# Patient Record
Sex: Female | Born: 1935 | Race: Black or African American | Hispanic: No | Marital: Married | Smoking: Never smoker
Health system: Southern US, Community
[De-identification: ages and names within clinical notes are randomized; demographics above are authoritative.]

## PROBLEM LIST (undated history)

## (undated) DIAGNOSIS — I10 Essential (primary) hypertension: Secondary | ICD-10-CM

## (undated) DIAGNOSIS — E119 Type 2 diabetes mellitus without complications: Secondary | ICD-10-CM

## (undated) DIAGNOSIS — N1832 Chronic kidney disease, stage 3b: Secondary | ICD-10-CM

---

## 2004-12-29 ENCOUNTER — Ambulatory Visit: Payer: Self-pay | Admitting: Internal Medicine

## 2006-01-15 ENCOUNTER — Ambulatory Visit: Payer: Self-pay | Admitting: Internal Medicine

## 2006-05-30 ENCOUNTER — Ambulatory Visit: Payer: Self-pay | Admitting: Unknown Physician Specialty

## 2007-03-18 ENCOUNTER — Ambulatory Visit: Payer: Self-pay | Admitting: Family Medicine

## 2008-03-19 ENCOUNTER — Ambulatory Visit: Payer: Self-pay | Admitting: Internal Medicine

## 2009-03-22 ENCOUNTER — Ambulatory Visit: Payer: Self-pay | Admitting: Family Medicine

## 2010-07-19 ENCOUNTER — Ambulatory Visit: Payer: Self-pay | Admitting: Family Medicine

## 2012-02-14 ENCOUNTER — Ambulatory Visit: Payer: Self-pay | Admitting: Family Medicine

## 2013-02-18 ENCOUNTER — Ambulatory Visit: Payer: Self-pay | Admitting: Family Medicine

## 2014-07-09 ENCOUNTER — Ambulatory Visit: Payer: Self-pay | Admitting: Family Medicine

## 2016-09-01 ENCOUNTER — Other Ambulatory Visit: Payer: Self-pay | Admitting: Internal Medicine

## 2016-09-01 DIAGNOSIS — E213 Hyperparathyroidism, unspecified: Secondary | ICD-10-CM

## 2016-11-24 ENCOUNTER — Ambulatory Visit
Admission: EM | Admit: 2016-11-24 | Discharge: 2016-11-24 | Disposition: A | Discharge status: Home / Self Care | Payer: Medicare Other | Attending: Family Medicine | Admitting: Family Medicine

## 2016-11-24 ENCOUNTER — Encounter: Payer: Self-pay | Admitting: Emergency Medicine

## 2016-11-24 DIAGNOSIS — R05 Cough: Secondary | ICD-10-CM | POA: Diagnosis not present

## 2016-11-24 DIAGNOSIS — J9801 Acute bronchospasm: Secondary | ICD-10-CM | POA: Diagnosis not present

## 2016-11-24 DIAGNOSIS — R059 Cough, unspecified: Secondary | ICD-10-CM

## 2016-11-24 HISTORY — DX: Essential (primary) hypertension: I10

## 2016-11-24 HISTORY — DX: Type 2 diabetes mellitus without complications: E11.9

## 2016-11-24 MED ORDER — BENZONATATE 100 MG PO CAPS: 100.0000 mg | ORAL_CAPSULE | Freq: Three times a day (TID) | ORAL | 0 refills | Status: DC | PRN

## 2016-11-24 MED ORDER — ALBUTEROL SULFATE HFA 108 (90 BASE) MCG/ACT IN AERS: 1.0000 | INHALATION_SPRAY | Freq: Four times a day (QID) | RESPIRATORY_TRACT | 0 refills | Status: AC | PRN

## 2016-11-24 MED ORDER — DOXYCYCLINE HYCLATE 100 MG PO TABS: 100.0000 mg | ORAL_TABLET | Freq: Two times a day (BID) | ORAL | 0 refills | Status: DC

## 2016-11-24 NOTE — ED Triage Notes (Signed)
Patient c/o cough and chest congestion for 2 weeks.   

## 2016-11-24 NOTE — ED Provider Notes (Signed)
[facility]    CSN: [id number] Arrival date & time: 11/24/16  0929     History   Chief Complaint Chief Complaint  Patient presents with  . Cough    HPI Daisy Hernandez is a 83 y.o. female.   The history is provided by the patient.  Cough  Cough characteristics:  Productive Associated symptoms: wheezing   URI  Presenting symptoms: congestion, cough and fatigue   Severity:  Moderate Onset quality:  Sudden Duration:  2 weeks Timing:  Constant Progression:  Worsening Chronicity:  New Relieved by:  Nothing Ineffective treatments:  OTC medications and decongestant Associated symptoms: wheezing   Risk factors: being elderly, chronic kidney disease, diabetes mellitus, recent illness and sick contacts   Risk factors: no chronic cardiac disease, no chronic respiratory disease, no immunosuppression and no recent travel     Past Medical History:  Diagnosis Date  . Diabetes mellitus without complication (HCC)   . Hypertension     There are no active problems to display for this patient.   History reviewed. No pertinent surgical history.  OB History    No data available       Home Medications    Prior to Admission medications   Medication Sig Start Date End Date Taking? Authorizing Provider  amLODipine (NORVASC) 5 MG tablet Take 5 mg by mouth daily.   Yes Historical Provider, MD  glimepiride (AMARYL) 2 MG tablet Take 2 mg by mouth daily with breakfast.   Yes Historical Provider, MD  pravastatin (PRAVACHOL) 20 MG tablet Take 20 mg by mouth daily.   Yes Historical Provider, MD  albuterol (PROVENTIL HFA;VENTOLIN HFA) 108 (90 Base) MCG/ACT inhaler Inhale 1-2 puffs into the lungs every 6 (six) hours as needed for wheezing or shortness of breath. 11/24/16   Payton Mccallum, MD  benzonatate (TESSALON) 100 MG capsule Take 1 capsule (100 mg total) by mouth 3 (three) times daily as needed for cough. 11/24/16   Payton Mccallum, MD  doxycycline (VIBRA-TABS) 100 MG  tablet Take 1 tablet (100 mg total) by mouth 2 (two) times daily. 11/24/16   Payton Mccallum, MD    Family History History reviewed. No pertinent family history.  Social History Social History  Substance Use Topics  . Smoking status: Never Smoker  . Smokeless tobacco: Never Used  . Alcohol use No     Allergies   Patient has no known allergies.   Review of Systems Review of Systems  Constitutional: Positive for fatigue.  HENT: Positive for congestion.   Respiratory: Positive for cough and wheezing.      Physical Exam Triage Vital Signs ED Triage Vitals  Enc Vitals Group     BP 11/24/16 1001 (!) 157/69     Pulse Rate 11/24/16 1001 77     Resp 11/24/16 1001 16     Temp 11/24/16 1001 98.6 F (37 C)     Temp Source 11/24/16 1001 Oral     SpO2 11/24/16 1001 96 %     Weight 11/24/16 0958 190 lb (86.2 kg)     Height 11/24/16 0958  (1.702 m)     Head Circumference --      Peak Flow --      Pain Score 11/24/16 0958 0     Pain Loc --      Pain Edu? --      Excl. in GC? --    No data found.   Updated Vital Signs BP (!) 157/69 (BP Location: Left  Arm)   Pulse 77   Temp 98.6 F (37 C) (Oral)   Resp 16   Ht  (1.702 m)   Wt 190 lb (86.2 kg)   SpO2 96%   BMI 29.76 kg/m   Visual Acuity Right Eye Distance:   Left Eye Distance:   Bilateral Distance:    Right Eye Near:   Left Eye Near:    Bilateral Near:     Physical Exam  Constitutional: She appears well-developed and well-nourished. No distress.  HENT:  Head: Normocephalic and atraumatic.  Right Ear: Tympanic membrane, external ear and ear canal normal.  Left Ear: Tympanic membrane, external ear and ear canal normal.  Nose: Rhinorrhea present. No mucosal edema, nose lacerations, sinus tenderness, nasal deformity, septal deviation or nasal septal hematoma. No epistaxis.  No foreign bodies. Right sinus exhibits no maxillary sinus tenderness and no frontal sinus tenderness. Left sinus exhibits no maxillary  sinus tenderness and no frontal sinus tenderness.  Mouth/Throat: Uvula is midline, oropharynx is clear and moist and mucous membranes are normal. No oropharyngeal exudate. No tonsillar exudate.  Eyes: Conjunctivae and EOM are normal. Pupils are equal, round, and reactive to light. Right eye exhibits no discharge. Left eye exhibits no discharge. No scleral icterus.  Neck: Normal range of motion. Neck supple. No thyromegaly present.  Cardiovascular: Normal rate, regular rhythm and normal heart sounds.   Pulmonary/Chest: Effort normal. No respiratory distress. She has wheezes (mild, diffuse, bilateral; and diffuse rhonchi). She has no rales.  Lymphadenopathy:    She has no cervical adenopathy.  Skin: She is not diaphoretic.  Nursing note and vitals reviewed.    UC Treatments / Results  Labs (all labs ordered are listed, but only abnormal results are displayed) Labs Reviewed - No data to display  EKG  EKG Interpretation None       Radiology No results found.  Procedures Procedures (including critical care time)  Medications Ordered in UC Medications - No data to display   Initial Impression / Assessment and Plan / UC Course  I have reviewed the triage vital signs and the nursing notes.  Pertinent labs & imaging results that were available during my care of the patient were reviewed by me and considered in my medical decision making (see chart for details).      Final Clinical Impressions(s) / UC Diagnoses   Final diagnoses:  Cough  Bronchospasm    New Prescriptions Discharge Medication List as of 11/24/2016 10:19 AM    START taking these medications   Details  albuterol (PROVENTIL HFA;VENTOLIN HFA) 108 (90 Base) MCG/ACT inhaler Inhale 1-2 puffs into the lungs every 6 (six) hours as needed for wheezing or shortness of breath., Starting Fri 11/24/2016, Normal    benzonatate (TESSALON) 100 MG capsule Take 1 capsule (100 mg total) by mouth 3 (three) times daily as  needed for cough., Starting Fri 11/24/2016, Normal    doxycycline (VIBRA-TABS) 100 MG tablet Take 1 tablet (100 mg total) by mouth 2 (two) times daily., Starting Fri 11/24/2016, Normal       1. diagnosis reviewed with patient 2. rx as per orders above; reviewed possible side effects, interactions, risks and benefits  3. Recommend supportive treatment with rest, fluids 4. Follow-up prn if symptoms worsen or don't improve   Payton Mccallum, MD 11/24/16 1045

## 2017-03-22 ENCOUNTER — Other Ambulatory Visit: Payer: Self-pay | Admitting: Nephrology

## 2017-03-28 ENCOUNTER — Ambulatory Visit
Admission: RE | Admit: 2017-03-28 | Discharge: 2017-03-28 | Disposition: A | Discharge status: Home / Self Care | Payer: Medicare Other | Source: Ambulatory Visit | Attending: Nephrology | Admitting: Nephrology

## 2017-03-28 ENCOUNTER — Ambulatory Visit: Payer: Medicare Other

## 2017-05-15 ENCOUNTER — Other Ambulatory Visit: Payer: Self-pay | Admitting: Family Medicine

## 2017-05-15 DIAGNOSIS — Z1231 Encounter for screening mammogram for malignant neoplasm of breast: Secondary | ICD-10-CM

## 2018-05-06 ENCOUNTER — Other Ambulatory Visit: Payer: Self-pay | Admitting: Nephrology

## 2018-09-04 ENCOUNTER — Other Ambulatory Visit: Payer: Self-pay | Admitting: Nephrology

## 2020-03-28 ENCOUNTER — Other Ambulatory Visit: Payer: Self-pay

## 2020-03-28 ENCOUNTER — Emergency Department: Payer: Medicare Other

## 2020-03-28 ENCOUNTER — Inpatient Hospital Stay
Admission: EM | Admit: 2020-03-28 | Discharge: 2020-03-31 | DRG: 690 | Disposition: A | Discharge status: Home / Self Care | Payer: Medicare Other | Attending: Student | Admitting: Student

## 2020-03-28 DIAGNOSIS — N10 Acute tubulo-interstitial nephritis: Secondary | ICD-10-CM | POA: Diagnosis not present

## 2020-03-28 DIAGNOSIS — Z1611 Resistance to penicillins: Secondary | ICD-10-CM | POA: Diagnosis present

## 2020-03-28 DIAGNOSIS — Z79899 Other long term (current) drug therapy: Secondary | ICD-10-CM

## 2020-03-28 DIAGNOSIS — N1832 Chronic kidney disease, stage 3b: Secondary | ICD-10-CM | POA: Diagnosis present

## 2020-03-28 DIAGNOSIS — Z20822 Contact with and (suspected) exposure to covid-19: Secondary | ICD-10-CM | POA: Diagnosis present

## 2020-03-28 DIAGNOSIS — I129 Hypertensive chronic kidney disease with stage 1 through stage 4 chronic kidney disease, or unspecified chronic kidney disease: Secondary | ICD-10-CM | POA: Diagnosis present

## 2020-03-28 DIAGNOSIS — Z7982 Long term (current) use of aspirin: Secondary | ICD-10-CM

## 2020-03-28 DIAGNOSIS — R7881 Bacteremia: Secondary | ICD-10-CM | POA: Diagnosis present

## 2020-03-28 DIAGNOSIS — N1831 Chronic kidney disease, stage 3a: Secondary | ICD-10-CM | POA: Diagnosis present

## 2020-03-28 DIAGNOSIS — E119 Type 2 diabetes mellitus without complications: Secondary | ICD-10-CM

## 2020-03-28 DIAGNOSIS — N179 Acute kidney failure, unspecified: Secondary | ICD-10-CM | POA: Diagnosis present

## 2020-03-28 DIAGNOSIS — R509 Fever, unspecified: Secondary | ICD-10-CM | POA: Diagnosis not present

## 2020-03-28 DIAGNOSIS — Z888 Allergy status to other drugs, medicaments and biological substances status: Secondary | ICD-10-CM

## 2020-03-28 DIAGNOSIS — E876 Hypokalemia: Secondary | ICD-10-CM | POA: Diagnosis present

## 2020-03-28 DIAGNOSIS — I10 Essential (primary) hypertension: Secondary | ICD-10-CM | POA: Diagnosis present

## 2020-03-28 DIAGNOSIS — E1122 Type 2 diabetes mellitus with diabetic chronic kidney disease: Secondary | ICD-10-CM | POA: Diagnosis present

## 2020-03-28 DIAGNOSIS — B961 Klebsiella pneumoniae [K. pneumoniae] as the cause of diseases classified elsewhere: Secondary | ICD-10-CM | POA: Diagnosis present

## 2020-03-28 DIAGNOSIS — R001 Bradycardia, unspecified: Secondary | ICD-10-CM | POA: Diagnosis present

## 2020-03-28 HISTORY — DX: Chronic kidney disease, stage 3b: N18.32

## 2020-03-28 LAB — COMPREHENSIVE METABOLIC PANEL
ALT: 11 U/L (ref 0–44)
AST: 18 U/L (ref 15–41)
Albumin: 4.3 g/dL (ref 3.5–5.0)
Alkaline Phosphatase: 56 U/L (ref 38–126)
Anion gap: 9 (ref 5–15)
BUN: 24 mg/dL — ABNORMAL HIGH (ref 8–23)
CO2: 24 mmol/L (ref 22–32)
Calcium: 9.8 mg/dL (ref 8.9–10.3)
Chloride: 105 mmol/L (ref 98–111)
Creatinine, Ser: 1.57 mg/dL — ABNORMAL HIGH (ref 0.44–1.00)
GFR calc Af Amer: 35 mL/min — ABNORMAL LOW (ref >60)
GFR calc non Af Amer: 30 mL/min — ABNORMAL LOW (ref >60)
Glucose, Bld: 150 mg/dL — ABNORMAL HIGH (ref 70–99)
Potassium: 3.3 mmol/L — ABNORMAL LOW (ref 3.5–5.1)
Sodium: 138 mmol/L (ref 135–145)
Total Bilirubin: 0.7 mg/dL (ref 0.3–1.2)
Total Protein: 6.8 g/dL (ref 6.5–8.1)

## 2020-03-28 LAB — URINALYSIS, COMPLETE (UACMP) WITH MICROSCOPIC
Bilirubin Urine: NEGATIVE
Glucose, UA: NEGATIVE mg/dL
Ketones, ur: NEGATIVE mg/dL
Nitrite: NEGATIVE
Protein, ur: 100 mg/dL — AB
RBC / HPF: >50 RBC/hpf — ABNORMAL HIGH (ref 0–5)
Specific Gravity, Urine: 1.015 (ref 1.005–1.030)
WBC, UA: >50 WBC/hpf — ABNORMAL HIGH (ref 0–5)
pH: 5 (ref 5.0–8.0)

## 2020-03-28 LAB — CBC
HCT: 35.1 % — ABNORMAL LOW (ref 36.0–46.0)
Hemoglobin: 11.7 g/dL — ABNORMAL LOW (ref 12.0–15.0)
MCH: 29.9 pg (ref 26.0–34.0)
MCHC: 33.3 g/dL (ref 30.0–36.0)
MCV: 89.8 fL (ref 80.0–100.0)
Platelets: 173 10*3/uL (ref 150–400)
RBC: 3.91 MIL/uL (ref 3.87–5.11)
RDW: 12.5 % (ref 11.5–15.5)
WBC: 11 10*3/uL — ABNORMAL HIGH (ref 4.0–10.5)
nRBC: 0 % (ref 0.0–0.2)

## 2020-03-28 LAB — POC SARS CORONAVIRUS 2 AG: SARS Coronavirus 2 Ag: NEGATIVE

## 2020-03-28 LAB — LACTIC ACID, PLASMA: Lactic Acid, Venous: 1.4 mmol/L (ref 0.5–1.9)

## 2020-03-28 NOTE — ED Provider Notes (Signed)
[facility] Emergency Department Provider Note   ____________________________________________    I have reviewed the triage vital signs and the nursing notes.   HISTORY  Chief Complaint Fever     HPI Daisy Hernandez is a 86 y.o. female with a history of diabetes and hypertension who presents with complaints of chills.  Daughter reports patient had a great day yesterday and was active at a cookout, today she was shaking and complaining of chills.  Overall though patient reports she feels "okay ".  Denies shortness of breath or cough.  No dysuria.  No rash reported.  No abdominal pain nausea or vomiting.  Has been vaccinated against COVID-19  Past Medical History:  Diagnosis Date  . Chronic kidney disease, stage 3b   . Diabetes mellitus without complication (HCC)   . Hypertension     Patient Active Problem List   Diagnosis Date Noted  . Sepsis due to urinary tract infection (HCC) 03/29/2020  . Acute pyelonephritis 03/29/2020  . Diabetes mellitus without complication (HCC)   . Hypertension   . Hypokalemia   . Chronic kidney disease, stage 3b     History reviewed. No pertinent surgical history.  Prior to Admission medications   Medication Sig Start Date End Date Taking? Authorizing Provider  albuterol (PROVENTIL HFA;VENTOLIN HFA) 108 (90 Base) MCG/ACT inhaler Inhale 1-2 puffs into the lungs every 6 (six) hours as needed for wheezing or shortness of breath. 11/24/16  Yes Payton Mccallum, MD  amLODipine (NORVASC) 10 MG tablet Take 10 mg by mouth daily. 02/22/20  Yes [provider]  aspirin 81 MG EC tablet Take 81 mg by mouth daily.   Yes [provider]  Cholecalciferol 50 MCG (2000 UT) CAPS Take 3 capsules daily for 3 months, then reduce to 1 capsule daily thereafter for Vitamin D Deficiency. 05/16/19  Yes [provider]  dorzolamide (TRUSOPT) 2 % ophthalmic solution Place 1 drop into both eyes 2 (two) times daily.  01/09/20  Yes [provider]  latanoprost (XALATAN) 0.005 % ophthalmic solution Place 1 drop into both eyes at bedtime. 02/15/20  Yes [provider]  losartan (COZAAR) 100 MG tablet Take 100 mg by mouth daily. 03/23/20  Yes [provider]  pravastatin (PRAVACHOL) 40 MG tablet Take 40 mg by mouth at bedtime. 02/23/20  Yes [provider]  timolol (TIMOPTIC) 0.5 % ophthalmic solution Place 1 drop into both eyes 2 (two) times daily. 01/08/20  Yes [provider]  glimepiride (AMARYL) 2 MG tablet Take 2 mg by mouth daily with breakfast. Patient not taking: Reported on 03/28/2020    [provider]     Allergies Ace inhibitors  History reviewed. No pertinent family history.  Social History Social History   Tobacco Use  . Smoking status: Never Smoker  . Smokeless tobacco: Never Used  Substance Use Topics  . Alcohol use: No  . Drug use: Not on file    Review of Systems  Constitutional: As above Eyes: No visual changes.  ENT: No sore throat. Cardiovascular: Denies chest pain. Respiratory: Denies shortness of breath. Gastrointestinal: No abdominal pain.  No nausea, no vomiting.   Genitourinary: Negative for dysuria. Musculoskeletal: Negative for back pain. Skin: Negative for rash. Neurological: Negative for headaches   ____________________________________________   PHYSICAL EXAM:  VITAL SIGNS: ED Triage Vitals  Enc Vitals Group     BP 03/28/20 2111 (!) 135/52     Pulse Rate 03/28/20 2111 86  Resp 03/28/20 2111 14     Temp 03/28/20 2111 (!) 103.4 F (39.7 C)     Temp Source 03/28/20 2111 Oral     SpO2 03/28/20 2111 97 %     Weight 03/28/20 2112 72 kg (158 lb 12.8 oz)     Height 03/28/20 2112 1.702 m (5\' 7" )     Head Circumference --      Peak Flow --      Pain Score 03/28/20 2112 0     Pain Loc --      Pain Edu? --      Excl. in GC? --     Constitutional: Alert and oriented.  Pleasant and interactive Eyes:  Conjunctivae are normal.  Head: Atraumatic. Nose: No congestion/rhinnorhea. Mouth/Throat: Mucous membranes are moist.   Neck:  Painless ROM Cardiovascular: Normal rate, regular rhythm.   Good peripheral circulation. Respiratory: Normal respiratory effort.  No retractions. Lungs CTAB. Gastrointestinal: Soft and nontender. No distention.  No CVA tenderness. Musculoskeletal:   Warm and well perfused Neurologic:  Normal speech and language. No gross focal neurologic deficits are appreciated.  Skin:  Skin is warm, dry and intact. No rash noted. Psychiatric: Mood and affect are normal. Speech and behavior are normal.  ____________________________________________   LABS (all labs ordered are listed, but only abnormal results are displayed)  Labs Reviewed  CBC - Abnormal; Notable for the following components:      Result Value   WBC 11.0 (*)    Hemoglobin 11.7 (*)    HCT 35.1 (*)    All other components within normal limits  COMPREHENSIVE METABOLIC PANEL - Abnormal; Notable for the following components:   Potassium 3.3 (*)    Glucose, Bld 150 (*)    BUN 24 (*)    Creatinine, Ser 1.57 (*)    GFR calc non Af Amer 30 (*)    GFR calc Af Amer 35 (*)    All other components within normal limits  URINALYSIS, COMPLETE (UACMP) WITH MICROSCOPIC - Abnormal; Notable for the following components:   Color, Urine YELLOW (*)    APPearance CLOUDY (*)    Hgb urine dipstick LARGE (*)    Protein, ur 100 (*)    Leukocytes,Ua MODERATE (*)    RBC / HPF >50 (*)    WBC, UA >50 (*)    Bacteria, UA MANY (*)    All other components within normal limits  BASIC METABOLIC PANEL - Abnormal; Notable for the following components:   Potassium 3.3 (*)    Glucose, Bld 114 (*)    Creatinine, Ser 1.31 (*)    GFR calc non Af Amer 37 (*)    GFR calc Af Amer 43 (*)    All other components within normal limits  MAGNESIUM - Abnormal; Notable for the following components:   Magnesium 1.6 (*)    All other components  within normal limits  CBC - Abnormal; Notable for the following components:   WBC 13.8 (*)    Hemoglobin 11.9 (*)    HCT 35.2 (*)    All other components within normal limits  GLUCOSE, CAPILLARY - Abnormal; Notable for the following components:   Glucose-Capillary 132 (*)    All other components within normal limits  SARS CORONAVIRUS 2 BY RT PCR (HOSPITAL ORDER, PERFORMED IN [facility])  CULTURE, BLOOD (ROUTINE X 2)  CULTURE, BLOOD (ROUTINE X 2)  URINE CULTURE  BLOOD CULTURE ID PANEL (REFLEXED) - BCID2  LACTIC ACID, PLASMA  LACTIC  ACID, PLASMA  GLUCOSE, CAPILLARY  HEMOGLOBIN A1C  POC SARS CORONAVIRUS 2 AG -  ED  POC SARS CORONAVIRUS 2 AG   ____________________________________________  EKG  None ____________________________________________  RADIOLOGY  Chest x-ray reviewed by me, no focal opacities ____________________________________________   PROCEDURES  Procedure(s) performed: No  Procedures   Critical Care performed: No ____________________________________________   INITIAL IMPRESSION / ASSESSMENT AND PLAN / ED COURSE  Pertinent labs & imaging results that were available during my care of the patient were reviewed by me and considered in my medical decision making (see chart for details).  Patient presents with chills and tremors.  Found to be febrile here.  Blood pressure and heart rate and SPO2 are reassuring  Differential includes COVID-19 breakthrough infection, pneumonia, urinary tract infection, other viral illness  Pending labs including lactic acid, CBC, CMP, chest x-ray, urinalysis  Patient is comfortable at this time.  Radiology read of x-ray suggest possible atypical infection given fever versus edema.  Pending Covid swab.  We will need to wait on Covid result to determine the need for antibiotics, if Covid breakthrough infection given reassuring vitals patient likely appropriate for discharge      ____________________________________________   FINAL CLINICAL IMPRESSION(S) / ED DIAGNOSES  Final diagnoses:  Fever        Note:  This document was prepared using Dragon voice recognition software and may include unintentional dictation errors.   Jene Every, MD 03/29/20 [phone]

## 2020-03-28 NOTE — ED Triage Notes (Signed)
Pt arrives from home via EMS with c/o fever of 102.8 at home. EMS reports pt's family reports new onset AMS, weakness, chills and fatigue. PT reported to ES tingling feeling with urinating, but denies this to this RN.   Pt is in NAD, pt is disoriented to situation but oriented to time, place, and person.  Temp: 103.4 oral.

## 2020-03-29 ENCOUNTER — Encounter: Payer: Self-pay | Admitting: Family Medicine

## 2020-03-29 DIAGNOSIS — R7881 Bacteremia: Secondary | ICD-10-CM | POA: Diagnosis present

## 2020-03-29 DIAGNOSIS — E876 Hypokalemia: Secondary | ICD-10-CM | POA: Diagnosis present

## 2020-03-29 DIAGNOSIS — R001 Bradycardia, unspecified: Secondary | ICD-10-CM | POA: Diagnosis present

## 2020-03-29 DIAGNOSIS — Z20822 Contact with and (suspected) exposure to covid-19: Secondary | ICD-10-CM | POA: Diagnosis present

## 2020-03-29 DIAGNOSIS — A419 Sepsis, unspecified organism: Secondary | ICD-10-CM | POA: Insufficient documentation

## 2020-03-29 DIAGNOSIS — I10 Essential (primary) hypertension: Secondary | ICD-10-CM | POA: Diagnosis present

## 2020-03-29 DIAGNOSIS — N1832 Chronic kidney disease, stage 3b: Secondary | ICD-10-CM | POA: Diagnosis not present

## 2020-03-29 DIAGNOSIS — E119 Type 2 diabetes mellitus without complications: Secondary | ICD-10-CM | POA: Diagnosis not present

## 2020-03-29 DIAGNOSIS — I129 Hypertensive chronic kidney disease with stage 1 through stage 4 chronic kidney disease, or unspecified chronic kidney disease: Secondary | ICD-10-CM | POA: Diagnosis present

## 2020-03-29 DIAGNOSIS — Z7982 Long term (current) use of aspirin: Secondary | ICD-10-CM | POA: Diagnosis not present

## 2020-03-29 DIAGNOSIS — Z79899 Other long term (current) drug therapy: Secondary | ICD-10-CM | POA: Diagnosis not present

## 2020-03-29 DIAGNOSIS — Z888 Allergy status to other drugs, medicaments and biological substances status: Secondary | ICD-10-CM | POA: Diagnosis not present

## 2020-03-29 DIAGNOSIS — N10 Acute tubulo-interstitial nephritis: Secondary | ICD-10-CM

## 2020-03-29 DIAGNOSIS — E1122 Type 2 diabetes mellitus with diabetic chronic kidney disease: Secondary | ICD-10-CM | POA: Diagnosis present

## 2020-03-29 DIAGNOSIS — Z1611 Resistance to penicillins: Secondary | ICD-10-CM | POA: Diagnosis present

## 2020-03-29 DIAGNOSIS — B961 Klebsiella pneumoniae [K. pneumoniae] as the cause of diseases classified elsewhere: Secondary | ICD-10-CM | POA: Diagnosis present

## 2020-03-29 DIAGNOSIS — N179 Acute kidney failure, unspecified: Secondary | ICD-10-CM | POA: Diagnosis present

## 2020-03-29 DIAGNOSIS — R509 Fever, unspecified: Secondary | ICD-10-CM | POA: Diagnosis present

## 2020-03-29 DIAGNOSIS — I150 Renovascular hypertension: Secondary | ICD-10-CM | POA: Diagnosis not present

## 2020-03-29 DIAGNOSIS — N390 Urinary tract infection, site not specified: Secondary | ICD-10-CM

## 2020-03-29 DIAGNOSIS — N1831 Chronic kidney disease, stage 3a: Secondary | ICD-10-CM | POA: Diagnosis present

## 2020-03-29 LAB — BLOOD CULTURE ID PANEL (REFLEXED) - BCID2

## 2020-03-29 LAB — BASIC METABOLIC PANEL
Anion gap: 9 (ref 5–15)
BUN: 19 mg/dL (ref 8–23)
CO2: 23 mmol/L (ref 22–32)
Calcium: 9.1 mg/dL (ref 8.9–10.3)
Chloride: 106 mmol/L (ref 98–111)
Creatinine, Ser: 1.31 mg/dL — ABNORMAL HIGH (ref 0.44–1.00)
GFR calc Af Amer: 43 mL/min — ABNORMAL LOW (ref >60)
GFR calc non Af Amer: 37 mL/min — ABNORMAL LOW (ref >60)
Glucose, Bld: 114 mg/dL — ABNORMAL HIGH (ref 70–99)
Potassium: 3.3 mmol/L — ABNORMAL LOW (ref 3.5–5.1)
Sodium: 138 mmol/L (ref 135–145)

## 2020-03-29 LAB — GLUCOSE, CAPILLARY
Glucose-Capillary: 94 mg/dL (ref 70–99)
Glucose-Capillary: 132 mg/dL — ABNORMAL HIGH (ref 70–99)
Glucose-Capillary: 115 mg/dL — ABNORMAL HIGH (ref 70–99)
Glucose-Capillary: 109 mg/dL — ABNORMAL HIGH (ref 70–99)

## 2020-03-29 LAB — CBC
HCT: 35.2 % — ABNORMAL LOW (ref 36.0–46.0)
Hemoglobin: 11.9 g/dL — ABNORMAL LOW (ref 12.0–15.0)
MCH: 30.4 pg (ref 26.0–34.0)
MCHC: 33.8 g/dL (ref 30.0–36.0)
MCV: 90 fL (ref 80.0–100.0)
Platelets: 160 10*3/uL (ref 150–400)
RBC: 3.91 MIL/uL (ref 3.87–5.11)
RDW: 12.6 % (ref 11.5–15.5)
WBC: 13.8 10*3/uL — ABNORMAL HIGH (ref 4.0–10.5)
nRBC: 0 % (ref 0.0–0.2)

## 2020-03-29 LAB — SARS CORONAVIRUS 2 BY RT PCR (HOSPITAL ORDER, PERFORMED IN ~~LOC~~ HOSPITAL LAB): SARS Coronavirus 2: NEGATIVE

## 2020-03-29 LAB — MAGNESIUM: Magnesium: 1.6 mg/dL — ABNORMAL LOW (ref 1.7–2.4)

## 2020-03-29 LAB — LACTIC ACID, PLASMA: Lactic Acid, Venous: 1 mmol/L (ref 0.5–1.9)

## 2020-03-29 MED ORDER — ONDANSETRON HCL 4 MG PO TABS: 4.0000 mg | ORAL_TABLET | Freq: Four times a day (QID) | ORAL | Status: DC | PRN

## 2020-03-29 MED ORDER — MAGNESIUM SULFATE 2 GM/50ML IV SOLN
2.0000 g | Freq: Once | INTRAVENOUS | Status: AC
  Administered 2020-03-29: 2 g via INTRAVENOUS
  Filled 2020-03-29: qty 50

## 2020-03-29 MED ORDER — SENNOSIDES-DOCUSATE SODIUM 8.6-50 MG PO TABS
2.0000 | ORAL_TABLET | Freq: Two times a day (BID) | ORAL | Status: DC
  Administered 2020-03-29 – 2020-03-31 (×5): 2 via ORAL
  Filled 2020-03-29 (×5): qty 2

## 2020-03-29 MED ORDER — ALBUTEROL SULFATE (2.5 MG/3ML) 0.083% IN NEBU: 2.5000 mg | INHALATION_SOLUTION | Freq: Four times a day (QID) | RESPIRATORY_TRACT | Status: DC | PRN

## 2020-03-29 MED ORDER — LATANOPROST 0.005 % OP SOLN
1.0000 [drp] | Freq: Every day | OPHTHALMIC | Status: DC
  Administered 2020-03-29 – 2020-03-30 (×2): 1 [drp] via OPHTHALMIC
  Filled 2020-03-29 (×3): qty 2.5

## 2020-03-29 MED ORDER — DORZOLAMIDE HCL 2 % OP SOLN
1.0000 [drp] | Freq: Two times a day (BID) | OPHTHALMIC | Status: DC
  Administered 2020-03-29 – 2020-03-31 (×7): 1 [drp] via OPHTHALMIC
  Filled 2020-03-29 (×2): qty 10

## 2020-03-29 MED ORDER — SODIUM CHLORIDE 0.9 % IV SOLN
500.0000 mg | INTRAVENOUS | Status: DC
  Administered 2020-03-29: 500 mg via INTRAVENOUS
  Filled 2020-03-29: qty 500

## 2020-03-29 MED ORDER — POTASSIUM CHLORIDE IN NACL 20-0.9 MEQ/L-% IV SOLN
INTRAVENOUS | Status: AC
  Filled 2020-03-29: qty 1000

## 2020-03-29 MED ORDER — SODIUM CHLORIDE 0.9 % IV SOLN
2.0000 g | INTRAVENOUS | Status: DC
  Administered 2020-03-29 – 2020-03-31 (×3): 2 g via INTRAVENOUS
  Filled 2020-03-29 (×2): qty 20
  Filled 2020-03-29 (×2): qty 2

## 2020-03-29 MED ORDER — ASPIRIN EC 81 MG PO TBEC
81.0000 mg | DELAYED_RELEASE_TABLET | Freq: Every day | ORAL | Status: DC
  Administered 2020-03-29 – 2020-03-31 (×3): 81 mg via ORAL
  Filled 2020-03-29 (×3): qty 1

## 2020-03-29 MED ORDER — HEPARIN SODIUM (PORCINE) 5000 UNIT/ML IJ SOLN
5000.0000 [IU] | Freq: Three times a day (TID) | INTRAMUSCULAR | Status: DC
  Administered 2020-03-29 – 2020-03-31 (×6): 5000 [IU] via SUBCUTANEOUS
  Filled 2020-03-29 (×7): qty 1

## 2020-03-29 MED ORDER — ACETAMINOPHEN 325 MG PO TABS: 650.0000 mg | ORAL_TABLET | Freq: Four times a day (QID) | ORAL | Status: DC | PRN

## 2020-03-29 MED ORDER — PRAVASTATIN SODIUM 20 MG PO TABS
40.0000 mg | ORAL_TABLET | Freq: Every day | ORAL | Status: DC
  Administered 2020-03-29 – 2020-03-30 (×2): 40 mg via ORAL
  Filled 2020-03-29: qty 1
  Filled 2020-03-29 (×2): qty 2

## 2020-03-29 MED ORDER — INSULIN ASPART 100 UNIT/ML ~~LOC~~ SOLN
0.0000 [IU] | Freq: Three times a day (TID) | SUBCUTANEOUS | Status: DC
  Administered 2020-03-29: 1 [IU] via SUBCUTANEOUS
  Filled 2020-03-29: qty 1

## 2020-03-29 MED ORDER — TIMOLOL MALEATE 0.5 % OP SOLN
1.0000 [drp] | Freq: Two times a day (BID) | OPHTHALMIC | Status: DC
  Administered 2020-03-29 – 2020-03-31 (×7): 1 [drp] via OPHTHALMIC
  Filled 2020-03-29 (×2): qty 5

## 2020-03-29 MED ORDER — POTASSIUM CHLORIDE 10 MEQ/100ML IV SOLN
10.0000 meq | INTRAVENOUS | Status: AC
  Administered 2020-03-29 (×2): 10 meq via INTRAVENOUS
  Filled 2020-03-29 (×2): qty 100

## 2020-03-29 MED ORDER — ONDANSETRON HCL 4 MG/2ML IJ SOLN: 4.0000 mg | Freq: Four times a day (QID) | INTRAMUSCULAR | Status: DC | PRN

## 2020-03-29 NOTE — ED Notes (Signed)
Pt in and out cathed by this RN and Lurena Joiner, RN per EDP verbal order d/t pt beign unable to provide clean catch urine sample. Urine specimen obtained, labeled, and sent to lab.   Pt tolerated well, peri care performed before and after procedure. Pt placed back on purewick to wall suction. Pt comfortable, warm blankets and water provided per request, call bell within pt's reach. Daughter bedside. No other needs expressed at this time.

## 2020-03-29 NOTE — Progress Notes (Signed)
PROGRESS NOTE    Daisy Hernandez  JSE:[id number] DOB: 07/21/1933 DOA: 03/28/2020 PCP: Anthonette Legato, MD   Chief complaint.  Fever. Brief Narrative:  Daisy Hernandez is a 86 y.o. female with medical history significant for type 2 diabetes mellitus, hypertension, and chronic kidney disease stage IIIb, now presenting to the emergency department with shaking chills, left flank pain, and lethargy.  Per patient daughter, she also had some dysuria for a few days which is better today. While in the emergency room, he was found to have a high fever, did not meet sepsis criteria.  Lactic acid level was normal.  She was placed on Rocephin for UTI, urine culture and blood cultures are sent out.   Assessment & Plan:   Principal Problem:   Sepsis due to urinary tract infection ([place]) Active Problems:   Diabetes mellitus without complication (HCC)   Hypertension   Hypokalemia   Chronic kidney disease, stage 3b  #1.  Acute pyelonephritis. Patient did not meet sepsis criteria.  Sepsis is ruled out. Continue Rocephin.  Pending urine culture and blood culture results.  2.  Chronic kidney disease stage IIIb. Renal function is stable.  3.  Essential hypertension. Continue monitor blood pressure, patient blood pressure was borderline yesterday.  4.  Hypokalemia and hypomagnesemia. Supplement.  5.  Type 2 diabetes. Continue sliding scale insulin.   DVT prophylaxis: Heparin subcu Code Status: Full Family Communication: Discussed with daughter in the room. Disposition Plan:  . Patient came from: Home            . Anticipated d/c place: Home . Barriers to d/c OR conditions which need to be met to effect a safe d/c:   Consultants:   None  Procedures: None Antimicrobials:  Rocephin.  Subjective: Patient feels much better today.  Dysuria has essentially resolved.  No back pain.  No additional fever or chills since admission. No abdominal pain or nausea  vomiting.  Objective: Vitals:   03/29/20 0100 03/29/20 0130 03/29/20 0647 03/29/20 1038  BP: (!) 111/48 (!) 120/52 (!) 139/62 (!) 139/58  Pulse: 60 55 80 66  Resp: 15 23 15 19   Temp: 98.6 F (37 C)  98.8 F (37.1 C)   TempSrc: Oral  Oral   SpO2: 98% 98% 95% 98%  Weight:      Height:        Intake/Output Summary (Last 24 hours) at 03/29/2020 1318 Last data filed at 03/29/2020 1307 Gross per 24 hour  Intake 1150 ml  Output --  Net 1150 ml   Filed Weights   03/28/20 2112  Weight: 72 kg    Examination:  General exam: Appears calm and comfortable  Respiratory system: Clear to auscultation. Respiratory effort normal. Cardiovascular system: S1 & S2 heard, RRR. No JVD, murmurs, rubs, gallops or clicks. No pedal edema. Gastrointestinal system: Abdomen is nondistended, soft and nontender. No organomegaly or masses felt. Normal bowel sounds heard. Central nervous system: Alert and oriented. No focal neurological deficits. Extremities: Symmetric 5 x 5 power. Skin: No rashes, lesions or ulcers Psychiatry: Judgement and insight appear normal. Mood & affect appropriate.     Data Reviewed: I have personally reviewed following labs and imaging studies  CBC: Recent Labs  Lab 03/28/20 2126 03/29/20 0806  WBC 11.0* 13.8*  HGB 11.7* 11.9*  HCT 35.1* 35.2*  MCV 89.8 90.0  PLT 173 073   Basic Metabolic Panel: Recent Labs  Lab 03/28/20 2126 03/29/20 0629  NA 138 138  K 3.3* 3.3*  CL 105 106  CO2 24 23  GLUCOSE 150* 114*  BUN 24* 19  CREATININE 1.57* 1.31*  CALCIUM 9.8 9.1  MG  --  1.6*   GFR: Estimated Creatinine Clearance: 31.1 mL/min (A) (by C-G formula based on SCr of 1.31 mg/dL (H)). Liver Function Tests: Recent Labs  Lab 03/28/20 2126  AST 18  ALT 11  ALKPHOS 56  BILITOT 0.7  PROT 6.8  ALBUMIN 4.3   No results for input(s): LIPASE, AMYLASE in the last 168 hours. No results for input(s): AMMONIA in the last 168 hours. Coagulation Profile: No results for  input(s): INR, PROTIME in the last 168 hours. Cardiac Enzymes: No results for input(s): CKTOTAL, CKMB, CKMBINDEX, TROPONINI in the last 168 hours. BNP (last 3 results) No results for input(s): PROBNP in the last 8760 hours. HbA1C: No results for input(s): HGBA1C in the last 72 hours. CBG: Recent Labs  Lab 03/29/20 0810 03/29/20 1216  GLUCAP 94 132*   Lipid Profile: No results for input(s): CHOL, HDL, LDLCALC, TRIG, CHOLHDL, LDLDIRECT in the last 72 hours. Thyroid Function Tests: No results for input(s): TSH, T4TOTAL, FREET4, T3FREE, THYROIDAB in the last 72 hours. Anemia Panel: No results for input(s): VITAMINB12, FOLATE, FERRITIN, TIBC, IRON, RETICCTPCT in the last 72 hours. Sepsis Labs: Recent Labs  Lab 03/28/20 2126 03/28/20 2343  LATICACIDVEN 1.4 1.0    Recent Results (from the past 240 hour(s))  SARS Coronavirus 2 by RT PCR (hospital order, performed in [facility] hospital lab) Nasopharyngeal Nasopharyngeal Swab     Status: None   Collection Time: 03/28/20 11:01 PM   Specimen: Nasopharyngeal Swab  Result Value Ref Range Status   SARS Coronavirus 2 NEGATIVE NEGATIVE Final    Comment: (NOTE) SARS-CoV-2 target nucleic acids are NOT DETECTED.  The SARS-CoV-2 RNA is generally detectable in upper and lower respiratory specimens during the acute phase of infection. The lowest concentration of SARS-CoV-2 viral copies this assay can detect is 250 copies / mL. A negative result does not preclude SARS-CoV-2 infection and should not be used as the sole basis for treatment or other patient management decisions.  A negative result may occur with improper specimen collection / handling, submission of specimen other than nasopharyngeal swab, presence of viral mutation(s) within the areas targeted by this assay, and inadequate number of viral copies (<250 copies / mL). A negative result must be combined with clinical observations, patient history, and epidemiological  information.  Fact Sheet for Patients:   StrictlyIdeas.no  Fact Sheet for Healthcare Providers: BankingDealers.co.za  This test is not yet approved or  cleared by the [place] FDA and has been authorized for detection and/or diagnosis of SARS-CoV-2 by FDA under an Emergency Use Authorization (EUA).  This EUA will remain in effect (meaning this test can be used) for the duration of the COVID-19 declaration under Section 564(b)(1) of the Act, 21 U.S.C. section 360bbb-3(b)(1), unless the authorization is terminated or revoked sooner.  Performed at [facility], [address] [place], [place] [address]   Blood culture (routine x 2)     Status: None (Preliminary result)   Collection Time: 03/28/20 11:43 PM   Specimen: BLOOD  Result Value Ref Range Status   Specimen Description BLOOD BLOOD RIGHT FOREARM  Final   Special Requests   Final    BOTTLES DRAWN AEROBIC AND ANAEROBIC Blood Culture adequate volume   Culture   Final    NO GROWTH < 12 HOURS Performed at [facility], [address] [place], [place] [address]    Report Status PENDING  Incomplete  Blood culture (routine x 2)     Status: None (Preliminary result)   Collection Time: 03/29/20 12:33 AM   Specimen: BLOOD  Result Value Ref Range Status   Specimen Description BLOOD RIGHT ANTECUBITAL  Final   Special Requests   Final    BOTTLES DRAWN AEROBIC AND ANAEROBIC Blood Culture adequate volume   Culture   Final    NO GROWTH < 12 HOURS Performed at [facility], [address] [place], [place] [address]    Report Status PENDING  Incomplete         Radiology Studies: DG Chest Port 1 View  Result Date: 03/28/2020 CLINICAL DATA:  Fever 102.8, new altered mental status, weakness, chills and fatigue EXAM: PORTABLE CHEST 1 VIEW COMPARISON:  None. FINDINGS: Coarse interstitial opacities towards the lung bases. No focally consolidative opacity  is seen however. There is some mild fissural and septal thickening and central vascular congestion. Cardiac silhouette may be borderline enlarged though possibly accentuated by portable technique. The aorta is calcified. The remaining cardiomediastinal contours are unremarkable. Telemetry leads overlie the chest. No acute osseous or soft tissue abnormality. Degenerative changes are present in the imaged spine and shoulders. IMPRESSION: 1. Basilar interstitial opacities, could reflect edema and/or atypical infection in the setting of fever. Electronically Signed   By: Lovena Le M.D.   On: 03/28/2020 22:15        Scheduled Meds: . aspirin EC  81 mg Oral Daily  . dorzolamide  1 drop Both Eyes BID  . heparin  5,000 Units Subcutaneous Q8H  . insulin aspart  0-9 Units Subcutaneous TID WC  . latanoprost  1 drop Both Eyes QHS  . pravastatin  40 mg Oral QHS  . senna-docusate  2 tablet Oral BID  . timolol  1 drop Both Eyes BID   Continuous Infusions: . cefTRIAXone (ROCEPHIN)  IV Stopped (03/29/20 0149)  . potassium chloride 10 mEq (03/29/20 1251)     LOS: 0 days    Time spent: 27 minutes    Sharen Hones, MD [facility]   To contact the attending provider between 7A-7P or the covering provider during after hours 7P-7A, please log into the web site [email or web address] and access using universal [place] password for that web site. If you do not have the password, please call the hospital operator.  03/29/2020, 1:18 PM

## 2020-03-29 NOTE — Progress Notes (Signed)
PHARMACY - PHYSICIAN COMMUNICATION CRITICAL VALUE ALERT - BLOOD CULTURE IDENTIFICATION (BCID)  Manaia Jaedynn Bohlken is an 86 y.o. female who presented to [facility] on 03/28/2020 with a chief complaint of acute pyelonephritis.  Assessment:  Initial call from microbiology lab with 1/4 bottles GNR, BCID detected Klebsiella pneumoniae (no resistance detected) which is likely secondary to urinary source. Additional call with 1/4 bottles GPC and BCID detected CoNS (possible contaminant).   Name of physician (or Provider) Contacted: Dr. Chipper Herb  Current antibiotics: Ceftriaxone 2 g q24h  Changes to prescribed antibiotics recommended:  Patient is on recommended antibiotics - No changes needed  Tressie Ellis 03/29/2020  8:12 PM

## 2020-03-29 NOTE — ED Notes (Signed)
Pt's daughter Jasmine December (pt's POA), requested to be updated with changes in pt status and/or bed placement. Her number is listed in pt's chart.

## 2020-03-29 NOTE — ED Notes (Signed)
Pt found saturated in bed with clothes and linen wet. Bed linen completely changed and pt belongings that were wet placed in pt belongings bag. Pt in gown at this time and new pure wick reapplied.

## 2020-03-29 NOTE — ED Notes (Signed)
Admitting MD bedside

## 2020-03-29 NOTE — Progress Notes (Signed)
CODE SEPSIS - PHARMACY COMMUNICATION  **Broad Spectrum Antibiotics should be administered within 1 hour of Sepsis diagnosis**  Time Code Sepsis Called/Page Received: 0033  Antibiotics Ordered: azithro/ceftriaxone  Time of 1st antibiotic administration: 0115  Additional action taken by pharmacy:   If necessary, Name of Provider/Nurse Contacted:     Thomasene Ripple ,PharmD Clinical Pharmacist  03/29/2020  2:04 AM

## 2020-03-29 NOTE — ED Notes (Addendum)
Pharmacy notified to send this medication d/t pyxis out of med. Will hang this abx as soon as it is available.

## 2020-03-29 NOTE — H&P (Signed)
History and Physical    Daisy Hernandez XVQ:[id number] DOB: [birth year] DOA: 03/28/2020  PCP: Mady Haagensen, MD   Patient coming from: Home   Chief Complaint: Shaking chills, left flank pain, lethargy, malaise   HPI: Daisy Hernandez is a 86 y.o. female with medical history significant for type 2 diabetes mellitus, hypertension, and chronic kidney disease stage IIIb, now presenting to the emergency department with shaking chills, left flank pain, and lethargy.  She is accompanied by a daughter who assists with the history.  Patient had reportedly been in her usual state yesterday until she was noted to be lethargic and then developed shaking chills.  She reports some left flank pain.  She denies dysuria at this time but reported dysuria to EMS per the documentation.  Patient denies any cough or shortness of breath.  She denies abdominal pain, headache, neck stiffness, rashes, or wounds.  ED Course: Upon arrival to the ED, patient is found to be febrile 39.7 C, saturating 99% on room air, normal respiratory rate and heart rate, and blood pressure 120/55.  Chest x-ray is read as basilar interstitial opacities.  Chemistry panel with potassium 3.3 and creatinine 1.57 which is similar to prior values.  Chemistry panel with WBC 11,000 and hemoglobin 11.7.  Lactic acid reassuringly normal.  Urinalysis consistent with infection.  Urine cultures were collected in the emergency department and the patient was treated with Rocephin and azithromycin.  Review of Systems:  All other systems reviewed and apart from HPI, are negative.  Past Medical History:  Diagnosis Date  . Chronic kidney disease, stage 3b   . Diabetes mellitus without complication (HCC)   . Hypertension     History reviewed. No pertinent surgical history.  Social History:   reports that she has never smoked. She has never used smokeless tobacco. She reports that she does not drink alcohol. No history on file for drug  use.  Allergies  Allergen Reactions  . Ace Inhibitors     Other reaction(s): Cough    History reviewed. No pertinent family history.   Prior to Admission medications   Medication Sig Start Date End Date Taking? Authorizing Provider  albuterol (PROVENTIL HFA;VENTOLIN HFA) 108 (90 Base) MCG/ACT inhaler Inhale 1-2 puffs into the lungs every 6 (six) hours as needed for wheezing or shortness of breath. 11/24/16  Yes Payton Mccallum, MD  amLODipine (NORVASC) 10 MG tablet Take 10 mg by mouth daily. 02/22/20  Yes [provider]  aspirin 81 MG EC tablet Take 81 mg by mouth daily.   Yes [provider]  Cholecalciferol 50 MCG (2000 UT) CAPS Take 3 capsules daily for 3 months, then reduce to 1 capsule daily thereafter for Vitamin D Deficiency. 05/16/19  Yes [provider]  dorzolamide (TRUSOPT) 2 % ophthalmic solution Place 1 drop into both eyes 2 (two) times daily. 01/09/20  Yes [provider]  latanoprost (XALATAN) 0.005 % ophthalmic solution Place 1 drop into both eyes at bedtime. 02/15/20  Yes [provider]  losartan (COZAAR) 100 MG tablet Take 100 mg by mouth daily. 03/23/20  Yes [provider]  pravastatin (PRAVACHOL) 40 MG tablet Take 40 mg by mouth at bedtime. 02/23/20  Yes [provider]  timolol (TIMOPTIC) 0.5 % ophthalmic solution Place 1 drop into both eyes 2 (two) times daily. 01/08/20  Yes [provider]  glimepiride (AMARYL) 2 MG tablet Take 2 mg by mouth daily with breakfast. Patient not taking: Reported on 03/28/2020    [provider]    Physical Exam: Vitals:   03/29/20 0000 03/29/20 0030 03/29/20 0100 03/29/20 0130  BP: (!) 128/48 122/67 (!) 111/48 (!) 120/52  Pulse: 71 71 60 55  Resp: (!) 25 (!) 25 15 23   Temp:   98.6 F (37 C)   TempSrc:   Oral   SpO2: 96% 97% 98% 98%  Weight:      Height:        Constitutional: NAD, calm  Eyes: PERTLA, lids and conjunctivae normal ENMT: Mucous membranes  are moist. Posterior pharynx clear of any exudate or lesions.   Neck: normal, supple, no masses, no thyromegaly Respiratory: no wheezing, no crackles. No accessory muscle use.  Cardiovascular: S1 & S2 heard, regular rate and rhythm. No extremity edema.   Abdomen: No distension, no tenderness, soft. Bowel sounds active.  Musculoskeletal: no clubbing / cyanosis. No joint deformity upper and lower extremities.   Skin: no significant rashes, lesions, ulcers. Warm, dry, well-perfused. Neurologic: CN 2-12 grossly intact. Sensation intact. Moving all extremtities.  Psychiatric: Alert and oriented to person, place, and situation. Very pleasant and cooperative.    Labs and Imaging on Admission: I have personally reviewed following labs and imaging studies  CBC: Recent Labs  Lab 03/28/20 2126  WBC 11.0*  HGB 11.7*  HCT 35.1*  MCV 89.8  PLT 173   Basic Metabolic Panel: Recent Labs  Lab 03/28/20 2126  NA 138  K 3.3*  CL 105  CO2 24  GLUCOSE 150*  BUN 24*  CREATININE 1.57*  CALCIUM 9.8   GFR: Estimated Creatinine Clearance: 25.9 mL/min (A) (by C-G formula based on SCr of 1.57 mg/dL (H)). Liver Function Tests: Recent Labs  Lab 03/28/20 2126  AST 18  ALT 11  ALKPHOS 56  BILITOT 0.7  PROT 6.8  ALBUMIN 4.3   No results for input(s): LIPASE, AMYLASE in the last 168 hours. No results for input(s): AMMONIA in the last 168 hours. Coagulation Profile: No results for input(s): INR, PROTIME in the last 168 hours. Cardiac Enzymes: No results for input(s): CKTOTAL, CKMB, CKMBINDEX, TROPONINI in the last 168 hours. BNP (last 3 results) No results for input(s): PROBNP in the last 8760 hours. HbA1C: No results for input(s): HGBA1C in the last 72 hours. CBG: No results for input(s): GLUCAP in the last 168 hours. Lipid Profile: No results for input(s): CHOL, HDL, LDLCALC, TRIG, CHOLHDL, LDLDIRECT in the last 72 hours. Thyroid Function Tests: No results for input(s): TSH, T4TOTAL,  FREET4, T3FREE, THYROIDAB in the last 72 hours. Anemia Panel: No results for input(s): VITAMINB12, FOLATE, FERRITIN, TIBC, IRON, RETICCTPCT in the last 72 hours. Urine analysis:    Component Value Date/Time   COLORURINE YELLOW (A) 03/28/2020 2331   APPEARANCEUR CLOUDY (A) 03/28/2020 2331   LABSPEC 1.015 03/28/2020 2331   PHURINE 5.0 03/28/2020 2331   GLUCOSEU NEGATIVE 03/28/2020 2331   HGBUR LARGE (A) 03/28/2020 2331   BILIRUBINUR NEGATIVE 03/28/2020 2331   KETONESUR NEGATIVE 03/28/2020 2331   PROTEINUR 100 (A) 03/28/2020 2331   NITRITE NEGATIVE 03/28/2020 2331   LEUKOCYTESUR MODERATE (A) 03/28/2020 2331   Sepsis Labs: @LABRCNTIP (procalcitonin:4,lacticidven:4) ) Recent Results (from the past 240 hour(s))  SARS Coronavirus 2 by RT PCR (hospital order, performed in [facility] hospital lab) Nasopharyngeal Nasopharyngeal Swab     Status: None   Collection Time: 03/28/20 11:01 PM   Specimen: Nasopharyngeal Swab  Result Value Ref Range Status   SARS Coronavirus 2 NEGATIVE NEGATIVE Final    Comment: (NOTE) SARS-CoV-2 target  nucleic acids are NOT DETECTED.  The SARS-CoV-2 RNA is generally detectable in upper and lower respiratory specimens during the acute phase of infection. The lowest concentration of SARS-CoV-2 viral copies this assay can detect is 250 copies / mL. A negative result does not preclude SARS-CoV-2 infection and should not be used as the sole basis for treatment or other patient management decisions.  A negative result may occur with improper specimen collection / handling, submission of specimen other than nasopharyngeal swab, presence of viral mutation(s) within the areas targeted by this assay, and inadequate number of viral copies (<250 copies / mL). A negative result must be combined with clinical observations, patient history, and epidemiological information.  Fact Sheet for Patients:   BoilerBrush.com.cyhttps://www.fda.gov/media/136312/download  Fact Sheet for Healthcare  Providers: https://pope.com/https://www.fda.gov/media/136313/download  This test is not yet approved or  cleared by the [place] FDA and has been authorized for detection and/or diagnosis of SARS-CoV-2 by FDA under an Emergency Use Authorization (EUA).  This EUA will remain in effect (meaning this test can be used) for the duration of the COVID-19 declaration under Section 564(b)(1) of the Act, 21 U.S.C. section 360bbb-3(b)(1), unless the authorization is terminated or revoked sooner.  Performed at [facility], [address] [place], [place] [address]      Radiological Exams on Admission: [facility]  Result Date: 03/28/2020 CLINICAL DATA:  Fever 102.8, new altered mental status, weakness, chills and fatigue EXAM: PORTABLE CHEST 1 VIEW COMPARISON:  None. FINDINGS: Coarse interstitial opacities towards the lung bases. No focally consolidative opacity is seen however. There is some mild fissural and septal thickening and central vascular congestion. Cardiac silhouette may be borderline enlarged though possibly accentuated by portable technique. The aorta is calcified. The remaining cardiomediastinal contours are unremarkable. Telemetry leads overlie the chest. No acute osseous or soft tissue abnormality. Degenerative changes are present in the imaged spine and shoulders. IMPRESSION: 1. Basilar interstitial opacities, could reflect edema and/or atypical infection in the setting of fever. Electronically Signed   By: Kreg ShropshirePrice  DeHay M.D.   On: 03/28/2020 22:15    Assessment/Plan   1. Pyelonephritis  - Presents with one day of lethargy and rigors, was having some pain in left flank, and is found to be febrile to 39.4C with mild leukocytosis and reassuringly normal lactate  - Fever was the only SIRS criteria on admission but with the rigors and left flank pain bacteremia is a concern  - Blood and urine cultures were collected and she was started on Rocephin and azithromycin in ED  -  Continue Rocephin, would continue 2 g IV q24h for now given concern for bacteremia, follow cultures and clinical course   2. CKD IIIb  - SCr is 1.57 on admission, consistent with her apparent baseline  - Renally-dose medications, monitor    3. Hypertension  - DBP has been low in ED and antihypertensives held on admission    4. Hypokalemia  - Serum potassium is 3.3 in ED  - KCl added to IVF, repeat chem panel in am    5. Type II DM  - Check CBGs, use a low-intensity SSI     DVT prophylaxis: sq heparin  Code Status: Full  Family Communication: Daughter updated at bedside  Disposition Plan:  Patient is from: Home  Anticipated d/c is to: TBD Anticipated d/c date is: 8/4 or 04/01/20 Patient currently: Pending cultures  Consults called: None  Admission status: Inpatient     Briscoe Deutscherimothy S Necole Minassian, MD [facility]  03/29/2020, 2:49 AM

## 2020-03-29 NOTE — Progress Notes (Signed)
Attempted to notify daughters to let them know pt was transferred to room 136. Left msg on Sandra's phone

## 2020-03-29 NOTE — ED Provider Notes (Addendum)
Admit discussed with Dr. Antionette Char (hospitalist)   Sharyn Creamer, MD 03/29/20 8721    Sharyn Creamer, MD 03/29/20 [phone]

## 2020-03-29 NOTE — ED Notes (Addendum)
Pt's daughter Dois Davenport updated by this RN and spoke with patient on phone.  Pt speaking in complete sentences, oriented x3, disoriented to situation has not changed since arival. NAD noted, pt provided with warm blankets, pt states she is comfortable.

## 2020-03-29 NOTE — ED Provider Notes (Signed)
Today's Vitals   03/28/20 2111 03/28/20 2112 03/28/20 2300  BP: (!) 135/52  (!) 123/55  Pulse: 86  83  Resp: 14  22  Temp: (!) 103.4 F (39.7 C)  100.3 F (37.9 C)  TempSrc: Oral  Oral  SpO2: 97%  99%  Weight:  72 kg   Height:  5\' 7"  (1.702 m)   PainSc:  0-No pain    Body mass index is 24.87 kg/m.   Patient resting comfortably. COVID negative. Possible multifocal PNA and UTI. Patient and family agreeable with admission. NO risk factors for HCAP or VAP noted.   , MD 03/29/20 [phone]

## 2020-03-30 DIAGNOSIS — N10 Acute tubulo-interstitial nephritis: Principal | ICD-10-CM

## 2020-03-30 DIAGNOSIS — R7881 Bacteremia: Secondary | ICD-10-CM

## 2020-03-30 DIAGNOSIS — B961 Klebsiella pneumoniae [K. pneumoniae] as the cause of diseases classified elsewhere: Secondary | ICD-10-CM

## 2020-03-30 LAB — HEMOGLOBIN A1C
Hgb A1c MFr Bld: 5.8 % — ABNORMAL HIGH (ref 4.8–5.6)
Mean Plasma Glucose: 120 mg/dL

## 2020-03-30 LAB — BASIC METABOLIC PANEL
Anion gap: 7 (ref 5–15)
BUN: 18 mg/dL (ref 8–23)
CO2: 25 mmol/L (ref 22–32)
Calcium: 9.5 mg/dL (ref 8.9–10.3)
Chloride: 105 mmol/L (ref 98–111)
Creatinine, Ser: 1.07 mg/dL — ABNORMAL HIGH (ref 0.44–1.00)
GFR calc Af Amer: 55 mL/min — ABNORMAL LOW (ref >60)
GFR calc non Af Amer: 48 mL/min — ABNORMAL LOW (ref >60)
Glucose, Bld: 98 mg/dL (ref 70–99)
Potassium: 3.7 mmol/L (ref 3.5–5.1)
Sodium: 137 mmol/L (ref 135–145)

## 2020-03-30 LAB — GLUCOSE, CAPILLARY
Glucose-Capillary: 77 mg/dL (ref 70–99)
Glucose-Capillary: 77 mg/dL (ref 70–99)
Glucose-Capillary: 96 mg/dL (ref 70–99)
Glucose-Capillary: 109 mg/dL — ABNORMAL HIGH (ref 70–99)
Glucose-Capillary: 103 mg/dL — ABNORMAL HIGH (ref 70–99)

## 2020-03-30 LAB — URINE CULTURE: Culture: >=100000 — AB

## 2020-03-30 LAB — CBC
HCT: 33.2 % — ABNORMAL LOW (ref 36.0–46.0)
Hemoglobin: 11 g/dL — ABNORMAL LOW (ref 12.0–15.0)
MCH: 29.8 pg (ref 26.0–34.0)
MCHC: 33.1 g/dL (ref 30.0–36.0)
MCV: 90 fL (ref 80.0–100.0)
Platelets: 157 10*3/uL (ref 150–400)
RBC: 3.69 MIL/uL — ABNORMAL LOW (ref 3.87–5.11)
RDW: 12.5 % (ref 11.5–15.5)
WBC: 9.4 10*3/uL (ref 4.0–10.5)
nRBC: 0 % (ref 0.0–0.2)

## 2020-03-30 LAB — MAGNESIUM: Magnesium: 2 mg/dL (ref 1.7–2.4)

## 2020-03-30 NOTE — Progress Notes (Signed)
   03/30/20 1400  Clinical Encounter Type  Visited With Patient and family together  Visit Type Initial  Referral From Chaplain  Consult/Referral To Chaplain  Chaplain stopped in this room in error, but had a great visit with patient and daughter, Daisy Hernandez. Patient will celebrate her birthday on Friday and she will be 86 yrs old. While the three talked, patient's daughter combed  Her hair. Patient talked about her children, one of which is deceased. She has one other daughter. They told chaplain that patient's husband died in 07/10/2019. Chaplain will check in on patient tomorrow afternoon.

## 2020-03-30 NOTE — Progress Notes (Addendum)
PROGRESS NOTE    Daisy [place]  YWV:[id number] DOB: [birth year] DOA: 03/28/2020 PCP: Mady Haagensen, MD   Chief complaint: fever and chills.  Brief Narrative:  Daisy Welte Thompsonis a 86 y.o.femalewith medical history significant fortype 2 diabetes mellitus, hypertension, and chronic kidney disease stage IIIb, now presenting to the emergency department with shaking chills, left flank pain, and lethargy.  Per patient daughter, she also had some dysuria for a few days which is better today. While in the emergency room, he was found to have a high fever, did not meet sepsis criteria.  Lactic acid level was normal.  She was placed on Rocephin for UTI, urine culture and blood cultures are sent out.  8/3.  Urine culture positive for Klebsiella pneumonia, blood culture was also positive, pending susceptibility.  Assessment & Plan:   Active Problems:   Diabetes mellitus without complication (HCC)   Hypertension   Hypokalemia   Chronic kidney disease, stage 3b   Acute pyelonephritis  #1.  Acute pyelonephritis with Klebsiella bacteremia. Patient did not meet sepsis criteria.  Blood culture was positive for Klebsiella, final susceptibility still pending.  Urine culture also positive for Klebsiella, susceptibilities finalized.  I will continue 1 more day IV Rocephin.  Patient probably can be discharged home tomorrow when final culture results available.  He probably can be treated with oral antibiotics with quinolone versus Bactrim.  2.  Chronic kidney disease stage IIIb.  Renal function stable.  3.  Essential hypertension. Continue monitor blood pressure.  4.  Hypokalemia and hypomagnesemia.  Supplemented, recheck level tomorrow.  5.  Type 2 diabetes. Continue sliding scale insulin.   Patient has a crackles in the base, but does not have any short of breath. No cough. I will check a BNP level.     Subjective: Patient condition much improved today.  No dysuria hematuria.   Flank pain has resolved. No nausea vomiting abdominal pain. No fever or chills.  Objective: Vitals:   03/29/20 1917 03/30/20 0007 03/30/20 0415 03/30/20 0751  BP: (!) 122/57 (!) 124/55 (!) 117/49 132/66  Pulse: 62 87 (!) 50 (!) 50  Resp: 18 18 18 15   Temp: 98.6 F (37 C) 98.4 F (36.9 C) 98.3 F (36.8 C) 98.5 F (36.9 C)  TempSrc: Oral Oral Oral Oral  SpO2: 100% 98% 99% 100%  Weight:      Height:        Intake/Output Summary (Last 24 hours) at 03/30/2020 1419 Last data filed at 03/30/2020 1351 Gross per 24 hour  Intake 763.03 ml  Output --  Net 763.03 ml   Filed Weights   03/28/20 2112  Weight: 72 kg    Examination:  General exam: Appears calm and comfortable  Respiratory system: Crackles in the base. Respiratory effort normal. Cardiovascular system: S1 & S2 heard, RRR. No JVD, murmurs, rubs, gallops or clicks. No pedal edema. Gastrointestinal system: Abdomen is nondistended, soft and nontender. No organomegaly or masses felt. Normal bowel sounds heard. Central nervous system: Alert and oriented. No focal neurological deficits. Extremities: Symmetric  Skin: No rashes, lesions or ulcers Psychiatry: Judgement and insight appear normal. Mood & affect appropriate.     Data Reviewed: I have personally reviewed following labs and imaging studies  CBC: Recent Labs  Lab 03/28/20 2126 03/29/20 0806 03/30/20 0427  WBC 11.0* 13.8* 9.4  HGB 11.7* 11.9* 11.0*  HCT 35.1* 35.2* 33.2*  MCV 89.8 90.0 90.0  PLT 173 160 157   Basic Metabolic Panel: Recent Labs  Lab  03/28/20 2126 03/29/20 0629 03/30/20 0427  NA 138 138 137  K 3.3* 3.3* 3.7  CL 105 106 105  CO2 24 23 25   GLUCOSE 150* 114* 98  BUN 24* 19 18  CREATININE 1.57* 1.31* 1.07*  CALCIUM 9.8 9.1 9.5  MG  --  1.6* 2.0   GFR: Estimated Creatinine Clearance: 38.1 mL/min (A) (by C-G formula based on SCr of 1.07 mg/dL (H)). Liver Function Tests: Recent Labs  Lab 03/28/20 2126  AST 18  ALT 11  ALKPHOS 56   BILITOT 0.7  PROT 6.8  ALBUMIN 4.3   No results for input(s): LIPASE, AMYLASE in the last 168 hours. No results for input(s): AMMONIA in the last 168 hours. Coagulation Profile: No results for input(s): INR, PROTIME in the last 168 hours. Cardiac Enzymes: No results for input(s): CKTOTAL, CKMB, CKMBINDEX, TROPONINI in the last 168 hours. BNP (last 3 results) No results for input(s): PROBNP in the last 8760 hours. HbA1C: Recent Labs    03/29/20 0629  HGBA1C 5.8*   CBG: Recent Labs  Lab 03/29/20 1216 03/29/20 1638 03/29/20 2114 03/30/20 0749 03/30/20 1142  GLUCAP 132* 115* 109* 77   77 96   Lipid Profile: No results for input(s): CHOL, HDL, LDLCALC, TRIG, CHOLHDL, LDLDIRECT in the last 72 hours. Thyroid Function Tests: No results for input(s): TSH, T4TOTAL, FREET4, T3FREE, THYROIDAB in the last 72 hours. Anemia Panel: No results for input(s): VITAMINB12, FOLATE, FERRITIN, TIBC, IRON, RETICCTPCT in the last 72 hours. Sepsis Labs: Recent Labs  Lab 03/28/20 2126 03/28/20 2343  LATICACIDVEN 1.4 1.0    Recent Results (from the past 240 hour(s))  SARS Coronavirus 2 by RT PCR (hospital order, performed in [facility] hospital lab) Nasopharyngeal Nasopharyngeal Swab     Status: None   Collection Time: 03/28/20 11:01 PM   Specimen: Nasopharyngeal Swab  Result Value Ref Range Status   SARS Coronavirus 2 NEGATIVE NEGATIVE Final    Comment: (NOTE) SARS-CoV-2 target nucleic acids are NOT DETECTED.  The SARS-CoV-2 RNA is generally detectable in upper and lower respiratory specimens during the acute phase of infection. The lowest concentration of SARS-CoV-2 viral copies this assay can detect is 250 copies / mL. A negative result does not preclude SARS-CoV-2 infection and should not be used as the sole basis for treatment or other patient management decisions.  A negative result may occur with improper specimen collection / handling, submission of specimen other than  nasopharyngeal swab, presence of viral mutation(s) within the areas targeted by this assay, and inadequate number of viral copies (<250 copies / mL). A negative result must be combined with clinical observations, patient history, and epidemiological information.  Fact Sheet for Patients:   05/28/20  Fact Sheet for Healthcare Providers: BoilerBrush.com.cy  This test is not yet approved or  cleared by the https://pope.com/ FDA and has been authorized for detection and/or diagnosis of SARS-CoV-2 by FDA under an Emergency Use Authorization (EUA).  This EUA will remain in effect (meaning this test can be used) for the duration of the COVID-19 declaration under Section 564(b)(1) of the Act, 21 U.S.C. section 360bbb-3(b)(1), unless the authorization is terminated or revoked sooner.  Performed at [facility], [address], [place], [place]   Urine culture     Status: Abnormal   Collection Time: 03/28/20 11:31 PM   Specimen: Urine, Random  Result Value Ref Range Status   Specimen Description   Final    URINE, RANDOM Performed at [facility],  [address], [place], [place] [address]    Special Requests   Final    NONE Performed at [facility], [address] [place], [place] [address]    Culture >=100,000 COLONIES/mL KLEBSIELLA PNEUMONIAE (A)  Final   Report Status 03/30/2020 FINAL  Final   Organism ID, Bacteria KLEBSIELLA PNEUMONIAE (A)  Final      Susceptibility   Klebsiella pneumoniae - MIC*    AMPICILLIN >=32 RESISTANT Resistant     CEFAZOLIN <=4 SENSITIVE Sensitive     CEFTRIAXONE <=0.25 SENSITIVE Sensitive     CIPROFLOXACIN <=0.25 SENSITIVE Sensitive     GENTAMICIN <=1 SENSITIVE Sensitive     IMIPENEM <=0.25 SENSITIVE Sensitive     NITROFURANTOIN <=16 SENSITIVE Sensitive     TRIMETH/SULFA <=20 SENSITIVE Sensitive     AMPICILLIN/SULBACTAM 8 SENSITIVE Sensitive     PIP/TAZO  <=4 SENSITIVE Sensitive     * >=100,000 COLONIES/mL KLEBSIELLA PNEUMONIAE  Blood culture (routine x 2)     Status: Abnormal (Preliminary result)   Collection Time: 03/28/20 11:43 PM   Specimen: BLOOD  Result Value Ref Range Status   Specimen Description   Final    BLOOD BLOOD RIGHT FOREARM Performed at [facility], [address] [place], [place] [address]    Special Requests   Final    BOTTLES DRAWN AEROBIC AND ANAEROBIC Blood Culture adequate volume Performed at [facility], [address] [place], [place] [address]    Culture  Setup Time   Final    Organism ID to follow GRAM NEGATIVE RODS AEROBIC BOTTLE ONLY CRITICAL RESULT CALLED TO, READ BACK BY AND VERIFIED WITH: ALEX CHAPPELL AT 1611 ON 03/29/20 SNG Performed at [facility], [address], [place], [place] [address]    Culture (A)  Final    KLEBSIELLA PNEUMONIAE SUSCEPTIBILITIES TO FOLLOW Performed at [facility], [address], [place], [place] [address]    Report Status PENDING  Incomplete  Blood Culture ID Panel (Reflexed)     Status: Abnormal   Collection Time: 03/28/20 11:43 PM  Result Value Ref Range Status   Enterococcus faecalis NOT DETECTED NOT DETECTED Final   Enterococcus Faecium NOT DETECTED NOT DETECTED Final   Listeria monocytogenes NOT DETECTED NOT DETECTED Final   Staphylococcus species NOT DETECTED NOT DETECTED Final   Staphylococcus aureus (BCID) NOT DETECTED NOT DETECTED Final   Staphylococcus epidermidis NOT DETECTED NOT DETECTED Final   Staphylococcus lugdunensis NOT DETECTED NOT DETECTED Final   Streptococcus species NOT DETECTED NOT DETECTED Final   Streptococcus agalactiae NOT DETECTED NOT DETECTED Final   Streptococcus pneumoniae NOT DETECTED NOT DETECTED Final   Streptococcus pyogenes NOT DETECTED NOT DETECTED Final   A.calcoaceticus-baumannii NOT DETECTED NOT DETECTED Final   Bacteroides fragilis NOT DETECTED NOT DETECTED Final   Enterobacterales  DETECTED (A) NOT DETECTED Final    Comment: Enterobacterales represent a large order of gram negative bacteria, not a single organism.   Enterobacter cloacae complex NOT DETECTED NOT DETECTED Final   Escherichia coli NOT DETECTED NOT DETECTED Final   Klebsiella aerogenes NOT DETECTED NOT DETECTED Final   Klebsiella oxytoca NOT DETECTED NOT DETECTED Final   Klebsiella pneumoniae DETECTED (A) NOT DETECTED Final    Comment: CRITICAL RESULT CALLED TO, READ BACK BY AND VERIFIED WITH: ALEX CHAPPELL AT 1611 ON 03/29/20 SNG    Proteus species NOT DETECTED NOT DETECTED Final   Salmonella species NOT DETECTED NOT DETECTED Final   Serratia marcescens NOT DETECTED NOT DETECTED  Final   Haemophilus influenzae NOT DETECTED NOT DETECTED Final   Neisseria meningitidis NOT DETECTED NOT DETECTED Final   Pseudomonas aeruginosa NOT DETECTED NOT DETECTED Final   Stenotrophomonas maltophilia NOT DETECTED NOT DETECTED Final   Candida albicans NOT DETECTED NOT DETECTED Final   Candida auris NOT DETECTED NOT DETECTED Final   Candida glabrata NOT DETECTED NOT DETECTED Final   Candida krusei NOT DETECTED NOT DETECTED Final   Candida parapsilosis NOT DETECTED NOT DETECTED Final   Candida tropicalis NOT DETECTED NOT DETECTED Final   Cryptococcus neoformans/gattii NOT DETECTED NOT DETECTED Final   CTX-M ESBL NOT DETECTED NOT DETECTED Final   Carbapenem resistance IMP NOT DETECTED NOT DETECTED Final   Carbapenem resistance KPC NOT DETECTED NOT DETECTED Final   Carbapenem resistance NDM NOT DETECTED NOT DETECTED Final   Carbapenem resist OXA 48 LIKE NOT DETECTED NOT DETECTED Final   Carbapenem resistance VIM NOT DETECTED NOT DETECTED Final    Comment: Performed at [facility], [address] [place], [place] [address]  Blood culture (routine x 2)     Status: None (Preliminary result)   Collection Time: 03/29/20 12:33 AM   Specimen: BLOOD  Result Value Ref Range Status   Specimen Description BLOOD RIGHT  ANTECUBITAL  Final   Special Requests   Final    BOTTLES DRAWN AEROBIC AND ANAEROBIC Blood Culture adequate volume   Culture  Setup Time   Final    GRAM POSITIVE COCCI AEROBIC BOTTLE ONLY CRITICAL RESULT CALLED TO, READ BACK BY AND VERIFIED WITH: ALEX CHAPPELL @1824  ON 03/29/20 SKL Performed at [facility], [address] [place], [place] [address]    Culture GRAM POSITIVE COCCI  Final   Report Status PENDING  Incomplete         Radiology Studies: DG Chest Port 1 View  Result Date: 03/28/2020 CLINICAL DATA:  Fever 102.8, new altered mental status, weakness, chills and fatigue EXAM: PORTABLE CHEST 1 VIEW COMPARISON:  None. FINDINGS: Coarse interstitial opacities towards the lung bases. No focally consolidative opacity is seen however. There is some mild fissural and septal thickening and central vascular congestion. Cardiac silhouette may be borderline enlarged though possibly accentuated by portable technique. The aorta is calcified. The remaining cardiomediastinal contours are unremarkable. Telemetry leads overlie the chest. No acute osseous or soft tissue abnormality. Degenerative changes are present in the imaged spine and shoulders. IMPRESSION: 1. Basilar interstitial opacities, could reflect edema and/or atypical infection in the setting of fever. Electronically Signed   By: Kreg ShropshirePrice  DeHay M.D.   On: 03/28/2020 22:15        Scheduled Meds:  aspirin EC  81 mg Oral Daily   dorzolamide  1 drop Both Eyes BID   heparin  5,000 Units Subcutaneous Q8H   insulin aspart  0-9 Units Subcutaneous TID WC   latanoprost  1 drop Both Eyes QHS   pravastatin  40 mg Oral QHS   senna-docusate  2 tablet Oral BID   timolol  1 drop Both Eyes BID   Continuous Infusions:  cefTRIAXone (ROCEPHIN)  IV 2 g (03/30/20 0206)     LOS: 1 day    Time spent: 28 minutes    Marrion Coyekui Camay Pedigo, MD [facility]   To contact the attending provider between 7A-7P or the covering  provider during after hours 7P-7A, please log into the web site [email or web address] and access using universal [place] password for that web site. If you do not have the password, please call the hospital  operator.  03/30/2020, 2:19 PM

## 2020-03-31 DIAGNOSIS — I150 Renovascular hypertension: Secondary | ICD-10-CM

## 2020-03-31 DIAGNOSIS — E1122 Type 2 diabetes mellitus with diabetic chronic kidney disease: Secondary | ICD-10-CM

## 2020-03-31 DIAGNOSIS — N183 Chronic kidney disease, stage 3 unspecified: Secondary | ICD-10-CM

## 2020-03-31 DIAGNOSIS — Z794 Long term (current) use of insulin: Secondary | ICD-10-CM

## 2020-03-31 LAB — BRAIN NATRIURETIC PEPTIDE: B Natriuretic Peptide: 129 pg/mL — ABNORMAL HIGH (ref 0.0–100.0)

## 2020-03-31 LAB — BASIC METABOLIC PANEL
Anion gap: 7 (ref 5–15)
BUN: 21 mg/dL (ref 8–23)
CO2: 25 mmol/L (ref 22–32)
Calcium: 9.5 mg/dL (ref 8.9–10.3)
Chloride: 106 mmol/L (ref 98–111)
Creatinine, Ser: 1.08 mg/dL — ABNORMAL HIGH (ref 0.44–1.00)
GFR calc Af Amer: 55 mL/min — ABNORMAL LOW (ref >60)
GFR calc non Af Amer: 47 mL/min — ABNORMAL LOW (ref >60)
Glucose, Bld: 87 mg/dL (ref 70–99)
Potassium: 3.8 mmol/L (ref 3.5–5.1)
Sodium: 138 mmol/L (ref 135–145)

## 2020-03-31 LAB — BLOOD CULTURE ID PANEL (REFLEXED) - BCID2

## 2020-03-31 LAB — CBC
HCT: 33 % — ABNORMAL LOW (ref 36.0–46.0)
Hemoglobin: 11.3 g/dL — ABNORMAL LOW (ref 12.0–15.0)
MCH: 29.9 pg (ref 26.0–34.0)
MCHC: 34.2 g/dL (ref 30.0–36.0)
MCV: 87.3 fL (ref 80.0–100.0)
Platelets: 166 10*3/uL (ref 150–400)
RBC: 3.78 MIL/uL — ABNORMAL LOW (ref 3.87–5.11)
RDW: 12.4 % (ref 11.5–15.5)
WBC: 6.6 10*3/uL (ref 4.0–10.5)
nRBC: 0 % (ref 0.0–0.2)

## 2020-03-31 LAB — GLUCOSE, CAPILLARY
Glucose-Capillary: 83 mg/dL (ref 70–99)
Glucose-Capillary: 77 mg/dL (ref 70–99)

## 2020-03-31 LAB — CULTURE, BLOOD (ROUTINE X 2)
Special Requests: ADEQUATE
Special Requests: ADEQUATE

## 2020-03-31 LAB — MAGNESIUM: Magnesium: 2.1 mg/dL (ref 1.7–2.4)

## 2020-03-31 MED ORDER — CEPHALEXIN 500 MG PO CAPS: 500.0000 mg | ORAL_CAPSULE | Freq: Three times a day (TID) | ORAL | 0 refills | Status: AC

## 2020-03-31 MED ORDER — PROBIOTIC ACIDOPHILUS PO CAPS
1.0000 | ORAL_CAPSULE | Freq: Two times a day (BID) | ORAL | 0 refills | Status: AC
Start: 2020-03-31 — End: ?

## 2020-03-31 NOTE — Discharge Summary (Signed)
Physician Discharge Summary  Daisy Hernandez ZOX:[id number] DOB: 1932/11/17 DOA: 03/28/2020  PCP: Daisy Haagensen, MD  Admit date: 03/28/2020 Discharge date: 03/31/2020  Admitted From: Home Disposition: Home  Recommendations for Outpatient Follow-up:  1. Follow ups as below. 2. Please obtain CBC/BMP/Mag at follow up 3. Please follow up on the following pending results: None  Home Health: None indicated Equipment/Devices: None indicated  Discharge Condition: Stable CODE STATUS: Full code   Follow-up Information    [name], [name], MD. Schedule an appointment as soon as possible for a visit in 1 week(s).   Specialty: Nephrology Contact information: [address] [place] [address] [phone]               HPI: Per Dr. Marcial Pacas Opyd "86 y.o. female with medical history significant for type 2 diabetes mellitus, hypertension, and chronic kidney disease stage IIIb, now presenting to the emergency department with shaking chills, left flank pain, and lethargy.  She is accompanied by a daughter who assists with the history.  Patient had reportedly been in her usual state yesterday until she was noted to be lethargic and then developed shaking chills.  She reports some left flank pain.  She denies dysuria at this time but reported dysuria to EMS per the documentation.  Patient denies any cough or shortness of breath.  She denies abdominal pain, headache, neck stiffness, rashes, or wounds.  ED Course: Upon arrival to the ED, patient is found to be febrile 39.7 C, saturating 99% on room air, normal respiratory rate and heart rate, and blood pressure 120/55.  Chest x-ray is read as basilar interstitial opacities.  Chemistry panel with potassium 3.3 and creatinine 1.57 which is similar to prior values.  Chemistry panel with WBC 11,000 and hemoglobin 11.7.  Lactic acid reassuringly normal.  Urinalysis consistent with infection.  Urine cultures were collected in the emergency  department and the patient was treated with Rocephin and azithromycin"  Hospital Course: Patient presented with fever, rigors and chills and admitted with acute pyelonephritis.  Blood and urine culture grew pansensitive Klebsiella pneumonia except for resistant to ampicillin.  She improved on IV ceftriaxone and transition to Keflex on discharge for 10 more days.  She was evaluated by PT/OT, and no need was identified.  See individual problem list below for more on hospital course.  Discharge Diagnoses:  Acute pyelonephritis with Klebsiella bacteremia-urine and blood cultures grew Klebsiella pneumonia -Treated with IV ceftriaxone 8/1-8/4, and discharged on Keflex 500 mg 3 times daily for 10 more days. -Recommended probiotics  AKI on CKD-3A: Cr 1.57 (admit)>>> 1.08 -Recheck BMP at follow-up -Minimize nephrotoxic meds  Essential hypertension: Normotensive -Continue home medications  Hypokalemia/hypomagnesemia: Resolved  Controlled IDDM-2 with CKD-3A -Reduced home Levemir to 15 units twice daily -Continue low-dose glimepiride and home statin.  Sinus bradycardia: Now symptomatic.  Not on nodal blocking agents.  Body mass index is 24.87 kg/m.            Discharge Exam: Vitals:   03/30/20 2355 03/31/20 0758  BP: (!) 156/63 (!) 145/70  Pulse: (!) 45 (!) 54  Resp: 17 16  Temp: (!) 97.5 F (36.4 C) 97.7 F (36.5 C)  SpO2: 100% 100%    GENERAL: No apparent distress.  Nontoxic. HEENT: MMM.  Vision and hearing grossly intact.  NECK: Supple.  No apparent JVD.  RESP:  No IWOB.  Fair aeration bilaterally. CVS: HR in 50s.  Regular rhythm.. Heart sounds normal.  ABD/GI/GU: Bowel sounds present. Soft. Non  tender.  No CVA tenderness. MSK/EXT:  Moves extremities. No apparent deformity. No edema.  SKIN: no apparent skin lesion or wound NEURO: Awake, alert and oriented appropriately.  No apparent focal neuro deficit. PSYCH: Calm. Normal affect.  Discharge Instructions  Discharge  Instructions    Call MD for:  extreme fatigue   Complete by: As directed    Call MD for:  persistant dizziness or light-headedness   Complete by: As directed    Call MD for:  persistant nausea and vomiting   Complete by: As directed    Call MD for:  severe uncontrolled pain   Complete by: As directed    Call MD for:  temperature >100.4   Complete by: As directed    Diet - low sodium heart healthy   Complete by: As directed    Diet Carb Modified   Complete by: As directed    Discharge instructions   Complete by: As directed    It has been a pleasure taking care of you!  You were hospitalized and treated for urinary tract/kidney infection.  We are discharging you more antibiotics to complete treatment course.  It is very important that you take the antibiotics until you complete the whole course.   We may have started you on other new medications or made some changes to your home medications during this hospitalization. Please review your new medication list and the directions carefully before you take them.    Please go to your hospital follow-up appointments or call to reschedule as recommended.   Take care,   Increase activity slowly   Complete by: As directed      Allergies as of 03/31/2020      Reactions   Ace Inhibitors    Other reaction(s): Cough      Medication List    TAKE these medications   albuterol 108 (90 Base) MCG/ACT inhaler Commonly known as: VENTOLIN HFA Inhale 1-2 puffs into the lungs every 6 (six) hours as needed for wheezing or shortness of breath.   amLODipine 10 MG tablet Commonly known as: NORVASC Take 10 mg by mouth daily.   aspirin 81 MG EC tablet Take 81 mg by mouth daily.   cephALEXin 500 MG capsule Commonly known as: KEFLEX Take 1 capsule (500 mg total) by mouth 3 (three) times daily for 10 days.   Cholecalciferol 50 MCG (2000 UT) Caps Take 3 capsules daily for 3 months, then reduce to 1 capsule daily thereafter for Vitamin D  Deficiency.   dorzolamide 2 % ophthalmic solution Commonly known as: TRUSOPT Place 1 drop into both eyes 2 (two) times daily.   glimepiride 2 MG tablet Commonly known as: AMARYL Take 2 mg by mouth daily with breakfast.   latanoprost 0.005 % ophthalmic solution Commonly known as: XALATAN Place 1 drop into both eyes at bedtime.   losartan 100 MG tablet Commonly known as: COZAAR Take 100 mg by mouth daily.   pravastatin 40 MG tablet Commonly known as: PRAVACHOL Take 40 mg by mouth at bedtime.   Probiotic Acidophilus Caps Take 1 tablet by mouth in the morning and at bedtime.   timolol 0.5 % ophthalmic solution Commonly known as: TIMOPTIC Place 1 drop into both eyes 2 (two) times daily.       Consultations:  None  Procedures/Studies:   DG Chest Port 1 View  Result Date: 03/28/2020 CLINICAL DATA:  Fever 102.8, new altered mental status, weakness, chills and fatigue EXAM: PORTABLE CHEST 1 VIEW COMPARISON:  None.  FINDINGS: Coarse interstitial opacities towards the lung bases. No focally consolidative opacity is seen however. There is some mild fissural and septal thickening and central vascular congestion. Cardiac silhouette may be borderline enlarged though possibly accentuated by portable technique. The aorta is calcified. The remaining cardiomediastinal contours are unremarkable. Telemetry leads overlie the chest. No acute osseous or soft tissue abnormality. Degenerative changes are present in the imaged spine and shoulders. IMPRESSION: 1. Basilar interstitial opacities, could reflect edema and/or atypical infection in the setting of fever. Electronically Signed   By: Kreg ShropshirePrice  DeHay M.D.   On: 03/28/2020 22:15        The results of significant diagnostics from this hospitalization (including imaging, microbiology, ancillary and laboratory) are listed below for reference.     Microbiology: Recent Results (from the past 240 hour(s))  SARS Coronavirus 2 by RT PCR (hospital  order, performed in [facility] hospital lab) Nasopharyngeal Nasopharyngeal Swab     Status: None   Collection Time: 03/28/20 11:01 PM   Specimen: Nasopharyngeal Swab  Result Value Ref Range Status   SARS Coronavirus 2 NEGATIVE NEGATIVE Final    Comment: (NOTE) SARS-CoV-2 target nucleic acids are NOT DETECTED.  The SARS-CoV-2 RNA is generally detectable in upper and lower respiratory specimens during the acute phase of infection. The lowest concentration of SARS-CoV-2 viral copies this assay can detect is 250 copies / mL. A negative result does not preclude SARS-CoV-2 infection and should not be used as the sole basis for treatment or other patient management decisions.  A negative result may occur with improper specimen collection / handling, submission of specimen other than nasopharyngeal swab, presence of viral mutation(s) within the areas targeted by this assay, and inadequate number of viral copies (<250 copies / mL). A negative result must be combined with clinical observations, patient history, and epidemiological information.  Fact Sheet for Patients:   BoilerBrush.com.cyhttps://www.fda.gov/media/136312/download  Fact Sheet for Healthcare Providers: https://pope.com/https://www.fda.gov/media/136313/download  This test is not yet approved or  cleared by the [place] FDA and has been authorized for detection and/or diagnosis of SARS-CoV-2 by FDA under an Emergency Use Authorization (EUA).  This EUA will remain in effect (meaning this test can be used) for the duration of the COVID-19 declaration under Section 564(b)(1) of the Act, 21 U.S.C. section 360bbb-3(b)(1), unless the authorization is terminated or revoked sooner.  Performed at [facility], [address] [place], [place] [address]   Urine culture     Status: Abnormal   Collection Time: 03/28/20 11:31 PM   Specimen: Urine, Random  Result Value Ref Range Status   Specimen Description   Final    URINE, RANDOM Performed at  [facility], [address] [place], [place] [address]    Special Requests   Final    NONE Performed at [facility], [address] [place], [place] [address]    Culture >=100,000 COLONIES/mL KLEBSIELLA PNEUMONIAE (A)  Final   Report Status 03/30/2020 FINAL  Final   Organism ID, Bacteria KLEBSIELLA PNEUMONIAE (A)  Final      Susceptibility   Klebsiella pneumoniae - MIC*    AMPICILLIN >=32 RESISTANT Resistant     CEFAZOLIN <=4 SENSITIVE Sensitive     CEFTRIAXONE <=0.25 SENSITIVE Sensitive     CIPROFLOXACIN <=0.25 SENSITIVE Sensitive     GENTAMICIN <=1 SENSITIVE Sensitive     IMIPENEM <=0.25 SENSITIVE Sensitive     NITROFURANTOIN <=16 SENSITIVE Sensitive     TRIMETH/SULFA <=20 SENSITIVE Sensitive     AMPICILLIN/SULBACTAM  8 SENSITIVE Sensitive     PIP/TAZO <=4 SENSITIVE Sensitive     * >=100,000 COLONIES/mL KLEBSIELLA PNEUMONIAE  Blood culture (routine x 2)     Status: Abnormal   Collection Time: 03/28/20 11:43 PM   Specimen: BLOOD  Result Value Ref Range Status   Specimen Description   Final    BLOOD BLOOD RIGHT FOREARM Performed at [facility], [address], [place], [place] [address]    Special Requests   Final    BOTTLES DRAWN AEROBIC AND ANAEROBIC Blood Culture adequate volume Performed at [facility], [address] [place], [place] [address]    Culture  Setup Time   Final    Organism ID to follow GRAM NEGATIVE RODS AEROBIC BOTTLE ONLY CRITICAL RESULT CALLED TO, READ BACK BY AND VERIFIED WITH: ALEX CHAPPELL AT 1611 ON 03/29/20 SNG Performed at [facility], [address] [place], [place] [address]    Culture KLEBSIELLA PNEUMONIAE (A)  Final   Report Status 03/31/2020 FINAL  Final   Organism ID, Bacteria KLEBSIELLA PNEUMONIAE  Final      Susceptibility   Klebsiella pneumoniae - MIC*    AMPICILLIN >=32 RESISTANT Resistant     CEFAZOLIN <=4 SENSITIVE Sensitive     CEFEPIME <=0.12 SENSITIVE Sensitive      CEFTAZIDIME <=1 SENSITIVE Sensitive     CEFTRIAXONE <=0.25 SENSITIVE Sensitive     CIPROFLOXACIN <=0.25 SENSITIVE Sensitive     GENTAMICIN <=1 SENSITIVE Sensitive     IMIPENEM <=0.25 SENSITIVE Sensitive     TRIMETH/SULFA <=20 SENSITIVE Sensitive     AMPICILLIN/SULBACTAM 8 SENSITIVE Sensitive     PIP/TAZO <=4 SENSITIVE Sensitive     * KLEBSIELLA PNEUMONIAE  Blood Culture ID Panel (Reflexed)     Status: Abnormal   Collection Time: 03/28/20 11:43 PM  Result Value Ref Range Status   Enterococcus faecalis NOT DETECTED NOT DETECTED Final   Enterococcus Faecium NOT DETECTED NOT DETECTED Final   Listeria monocytogenes NOT DETECTED NOT DETECTED Final   Staphylococcus species NOT DETECTED NOT DETECTED Final   Staphylococcus aureus (BCID) NOT DETECTED NOT DETECTED Final   Staphylococcus epidermidis NOT DETECTED NOT DETECTED Final   Staphylococcus lugdunensis NOT DETECTED NOT DETECTED Final   Streptococcus species NOT DETECTED NOT DETECTED Final   Streptococcus agalactiae NOT DETECTED NOT DETECTED Final   Streptococcus pneumoniae NOT DETECTED NOT DETECTED Final   Streptococcus pyogenes NOT DETECTED NOT DETECTED Final   A.calcoaceticus-baumannii NOT DETECTED NOT DETECTED Final   Bacteroides fragilis NOT DETECTED NOT DETECTED Final   Enterobacterales DETECTED (A) NOT DETECTED Final    Comment: Enterobacterales represent a large order of gram negative bacteria, not a single organism.   Enterobacter cloacae complex NOT DETECTED NOT DETECTED Final   Escherichia coli NOT DETECTED NOT DETECTED Final   Klebsiella aerogenes NOT DETECTED NOT DETECTED Final   Klebsiella oxytoca NOT DETECTED NOT DETECTED Final   Klebsiella pneumoniae DETECTED (A) NOT DETECTED Final    Comment: CRITICAL RESULT CALLED TO, READ BACK BY AND VERIFIED WITH: ALEX CHAPPELL AT 1611 ON 03/29/20 SNG    Proteus species NOT DETECTED NOT DETECTED Final   Salmonella species NOT DETECTED NOT DETECTED Final   Serratia marcescens NOT  DETECTED NOT DETECTED Final   Haemophilus influenzae NOT DETECTED NOT DETECTED Final   Neisseria meningitidis NOT DETECTED NOT DETECTED Final   Pseudomonas aeruginosa NOT DETECTED NOT DETECTED Final   Stenotrophomonas maltophilia NOT DETECTED NOT DETECTED Final   Candida albicans NOT DETECTED NOT DETECTED Final  Candida auris NOT DETECTED NOT DETECTED Final   Candida glabrata NOT DETECTED NOT DETECTED Final   Candida krusei NOT DETECTED NOT DETECTED Final   Candida parapsilosis NOT DETECTED NOT DETECTED Final   Candida tropicalis NOT DETECTED NOT DETECTED Final   Cryptococcus neoformans/gattii NOT DETECTED NOT DETECTED Final   CTX-M ESBL NOT DETECTED NOT DETECTED Final   Carbapenem resistance IMP NOT DETECTED NOT DETECTED Final   Carbapenem resistance KPC NOT DETECTED NOT DETECTED Final   Carbapenem resistance NDM NOT DETECTED NOT DETECTED Final   Carbapenem resist OXA 48 LIKE NOT DETECTED NOT DETECTED Final   Carbapenem resistance VIM NOT DETECTED NOT DETECTED Final    Comment: Performed at [facility], [address] [place], [place] [address]  Blood culture (routine x 2)     Status: None (Preliminary result)   Collection Time: 03/29/20 12:33 AM   Specimen: BLOOD  Result Value Ref Range Status   Specimen Description BLOOD RIGHT ANTECUBITAL  Final   Special Requests   Final    BOTTLES DRAWN AEROBIC AND ANAEROBIC Blood Culture adequate volume   Culture  Setup Time   Final    GRAM POSITIVE COCCI AEROBIC BOTTLE ONLY CRITICAL RESULT CALLED TO, READ BACK BY AND VERIFIED WITH: ALEX CHAPPELL @1824  ON 03/29/20 SKL Performed at [facility], [address] [place], [place]    Culture GRAM POSITIVE COCCI  Final   Report Status PENDING  Incomplete  Blood Culture ID Panel (Reflexed)     Status: Abnormal   Collection Time: 03/29/20 12:33 AM  Result Value Ref Range Status   Enterococcus faecalis NOT DETECTED NOT DETECTED Final   Enterococcus Faecium NOT  DETECTED NOT DETECTED Final   Listeria monocytogenes NOT DETECTED NOT DETECTED Final   Staphylococcus species DETECTED (A) NOT DETECTED Final    Comment: CRITICAL RESULT CALLED TO, READ BACK BY AND VERIFIED WITH: ALEX CHAPPELL 03/29/20 1824 SKL    Staphylococcus aureus (BCID) NOT DETECTED NOT DETECTED Final   Staphylococcus epidermidis NOT DETECTED NOT DETECTED Final   Staphylococcus lugdunensis NOT DETECTED NOT DETECTED Final   Streptococcus species NOT DETECTED NOT DETECTED Final   Streptococcus agalactiae NOT DETECTED NOT DETECTED Final   Streptococcus pneumoniae NOT DETECTED NOT DETECTED Final   Streptococcus pyogenes NOT DETECTED NOT DETECTED Final   A.calcoaceticus-baumannii NOT DETECTED NOT DETECTED Final   Bacteroides fragilis NOT DETECTED NOT DETECTED Final   Enterobacterales NOT DETECTED NOT DETECTED Final   Enterobacter cloacae complex NOT DETECTED NOT DETECTED Final   Escherichia coli NOT DETECTED NOT DETECTED Final   Klebsiella aerogenes NOT DETECTED NOT DETECTED Final   Klebsiella oxytoca NOT DETECTED NOT DETECTED Final   Klebsiella pneumoniae NOT DETECTED NOT DETECTED Final   Proteus species NOT DETECTED NOT DETECTED Final   Salmonella species NOT DETECTED NOT DETECTED Final   Serratia marcescens NOT DETECTED NOT DETECTED Final   Haemophilus influenzae NOT DETECTED NOT DETECTED Final   Neisseria meningitidis NOT DETECTED NOT DETECTED Final   Pseudomonas aeruginosa NOT DETECTED NOT DETECTED Final   Stenotrophomonas maltophilia NOT DETECTED NOT DETECTED Final   Candida albicans NOT DETECTED NOT DETECTED Final   Candida auris NOT DETECTED NOT DETECTED Final   Candida glabrata NOT DETECTED NOT DETECTED Final   Candida krusei NOT DETECTED NOT DETECTED Final   Candida parapsilosis NOT DETECTED NOT DETECTED Final   Candida tropicalis NOT DETECTED NOT DETECTED Final   Cryptococcus neoformans/gattii NOT DETECTED NOT DETECTED Final    Comment: Performed at 05/29/20  [facility],  [address] [place], [place] [address]     Labs: BNP (last 3 results) Recent Labs    03/31/20 0506  BNP 129.0*   Basic Metabolic Panel: Recent Labs  Lab 03/28/20 2126 03/29/20 0629 03/30/20 0427 03/31/20 0506  NA 138 138 137 138  K 3.3* 3.3* 3.7 3.8  CL 105 106 105 106  CO2 24 23 25 25   GLUCOSE 150* 114* 98 87  BUN 24* 19 18 21   CREATININE 1.57* 1.31* 1.07* 1.08*  CALCIUM 9.8 9.1 9.5 9.5  MG  --  1.6* 2.0 2.1   Liver Function Tests: Recent Labs  Lab 03/28/20 2126  AST 18  ALT 11  ALKPHOS 56  BILITOT 0.7  PROT 6.8  ALBUMIN 4.3   No results for input(s): LIPASE, AMYLASE in the last 168 hours. No results for input(s): AMMONIA in the last 168 hours. CBC: Recent Labs  Lab 03/28/20 2126 03/29/20 0806 03/30/20 0427 03/31/20 0506  WBC 11.0* 13.8* 9.4 6.6  HGB 11.7* 11.9* 11.0* 11.3*  HCT 35.1* 35.2* 33.2* 33.0*  MCV 89.8 90.0 90.0 87.3  PLT 173 160 157 166   Cardiac Enzymes: No results for input(s): CKTOTAL, CKMB, CKMBINDEX, TROPONINI in the last 168 hours. BNP: Invalid input(s): POCBNP CBG: Recent Labs  Lab 03/30/20 0749 03/30/20 1142 03/30/20 1700 03/30/20 2116 03/31/20 0822  GLUCAP 77  77 96 109* 103* 83   D-Dimer No results for input(s): DDIMER in the last 72 hours. Hgb A1c Recent Labs    03/29/20 0629  HGBA1C 5.8*   Lipid Profile No results for input(s): CHOL, HDL, LDLCALC, TRIG, CHOLHDL, LDLDIRECT in the last 72 hours. Thyroid function studies No results for input(s): TSH, T4TOTAL, T3FREE, THYROIDAB in the last 72 hours.  Invalid input(s): FREET3 Anemia work up No results for input(s): VITAMINB12, FOLATE, FERRITIN, TIBC, IRON, RETICCTPCT in the last 72 hours. Urinalysis    Component Value Date/Time   COLORURINE YELLOW (A) 03/28/2020 2331   APPEARANCEUR CLOUDY (A) 03/28/2020 2331   LABSPEC 1.015 03/28/2020 2331   PHURINE 5.0 03/28/2020 2331   GLUCOSEU NEGATIVE 03/28/2020 2331   HGBUR LARGE (A) 03/28/2020 2331   BILIRUBINUR  NEGATIVE 03/28/2020 2331   KETONESUR NEGATIVE 03/28/2020 2331   PROTEINUR 100 (A) 03/28/2020 2331   NITRITE NEGATIVE 03/28/2020 2331   LEUKOCYTESUR MODERATE (A) 03/28/2020 2331   Sepsis Labs Invalid input(s): PROCALCITONIN,  WBC,  LACTICIDVEN   Time coordinating discharge: 35 minutes  SIGNED:  05/28/2020, MD  [facility] 03/31/2020, 10:22 AM  If 7PM-7AM, please contact night-coverage [email or web address] Password [email or web address]

## 2020-03-31 NOTE — Progress Notes (Signed)
OT Cancellation Note  Patient Details Name: Daisy Hernandez MRN: [id number] DOB: 10-02-1932   Cancelled Treatment:    Reason Eval/Treat Not Completed: OT screened, no needs identified, will sign off. Per conversation c PT pt is ambulating c no AD, at baseline. Upon entry pt presents fully dressed and reporting no skilled acute OT needs. Will sign off, please re-consult if new needs arise.   Kathie Dike, M.S. OTR/L  03/31/20, 11:13 AM  ascom [phone]

## 2020-03-31 NOTE — Evaluation (Signed)
Physical Therapy Evaluation Patient Details Name: Daisy Hernandez MRN: [id number] DOB: 1933/05/13 Today's Date: 03/31/2020   History of Present Illness  Pt is an 86 y.o. female presenting to hospital 8/1 with chills, fever, L flank pain, lethargy, and malaise.  Pt admitted with pyelonephritis and hypokalemia.  PMH includes DM, htn, CKD stage 3b.  Clinical Impression  Prior to hospital admission, pt was independent; lives with her son on main level of home.  Currently pt is modified independent with bed mobility, independent with transfers, independent with ambulation around nursing loop, and modified independent navigating 4 steps with railing.  Pt appears to be at baseline level of functional mobility; pt steady and safe with functional mobility throughout PT session.  No acute PT needs identified; will sign off-- care management notified.    Follow Up Recommendations No PT follow up    Equipment Recommendations  None recommended by PT    Recommendations for Other Services       Precautions / Restrictions Precautions Precautions: Fall Restrictions Weight Bearing Restrictions: No      Mobility  Bed Mobility Overal bed mobility: Modified Independent             General bed mobility comments: Sit to semi-supine in bed without any noted difficulties  Transfers Overall transfer level: Independent Equipment used: None             General transfer comment: steady safe transfers noted  Ambulation/Gait Ambulation/Gait assistance: Independent Gait Distance (Feet): 230 Feet Assistive device: None Gait Pattern/deviations: WFL(Within Functional Limits)     General Gait Details: steady ambulation  Stairs Stairs: Yes Stairs assistance: Modified independent (Device/Increase time) Stair Management: One rail Right;Alternating pattern;Step to pattern;Forwards Number of Stairs: 4 General stair comments: alternating pattern ascending steps and step to pattern descending  steps; pt appearing more cautious but was steady and safe  Wheelchair Mobility    Modified Rankin (Stroke Patients Only)       Balance Overall balance assessment: Needs assistance Sitting-balance support: No upper extremity supported;Feet supported Sitting balance-Leahy Scale: Normal Sitting balance - Comments: steady sitting reaching outside BOS   Standing balance support: No upper extremity supported;During functional activity Standing balance-Leahy Scale: Normal Standing balance comment: steady standing leaning over looking in her personal bag and also with ambulation/functional mobility                             Pertinent Vitals/Pain Pain Assessment: No/denies pain  Vitals (HR and O2 on room air) stable and WFL throughout treatment session.    Home Living Family/patient expects to be discharged to:: Private residence Living Arrangements: Children (pt's son) Available Help at Discharge: Family Type of Home: House Home Access: Stairs to enter Entrance Stairs-Rails: Right Entrance Stairs-Number of Steps: 3 Home Layout: Two level;Able to live on main level with bedroom/bathroom Home Equipment: Grab bars - tub/shower;Grab bars - toilet;Shower seat;Walker - 2 wheels;Cane - single point;Bedside commode      Prior Function Level of Independence: Independent         Comments: Pt reports no falls in past 6 months.     Hand Dominance        Extremity/Trunk Assessment   Upper Extremity Assessment Upper Extremity Assessment: Overall WFL for tasks assessed    Lower Extremity Assessment Lower Extremity Assessment: Overall WFL for tasks assessed    Cervical / Trunk Assessment Cervical / Trunk Assessment: Normal  Communication   Communication: No difficulties  Cognition Arousal/Alertness: Awake/alert Behavior During Therapy: WFL for tasks assessed/performed Overall Cognitive Status: Within Functional Limits for tasks assessed                                  General Comments: Mild confusion noted at times but oriented to person, place, month/year, and situation.      General Comments   Nursing cleared pt for participation in physical therapy.  Pt agreeable to PT session.    Exercises     Assessment/Plan    PT Assessment Patent does not need any further PT services  PT Problem List         PT Treatment Interventions      PT Goals (Current goals can be found in the Care Plan section)  Acute Rehab PT Goals Patient Stated Goal: to go home PT Goal Formulation: With patient Time For Goal Achievement: 04/14/20 Potential to Achieve Goals: Good    Frequency     Barriers to discharge        Co-evaluation               AM-PAC PT "6 Clicks" Mobility  Outcome Measure Help needed turning from your back to your side while in a flat bed without using bedrails?: None Help needed moving from lying on your back to sitting on the side of a flat bed without using bedrails?: None Help needed moving to and from a bed to a chair (including a wheelchair)?: None Help needed standing up from a chair using your arms (e.g., wheelchair or bedside chair)?: None Help needed to walk in hospital room?: None Help needed climbing 3-5 steps with a railing? : None 6 Click Score: 24    End of Session Equipment Utilized During Treatment: Gait belt Activity Tolerance: Patient tolerated treatment well Patient left: in bed;with call bell/phone within reach;with bed alarm set Nurse Communication: Mobility status;Precautions PT Visit Diagnosis: Muscle weakness (generalized) (M62.81)    Time: 1194-1740 PT Time Calculation (min) (ACUTE ONLY): 20 min   Charges:   PT Evaluation $PT Eval Low Complexity: 1 Low         Darnelle Derrick, PT 03/31/20, 10:50 AM

## 2022-05-16 IMAGING — DX DG CHEST 1V PORT
1 series · 1 of 1 positions shown · non-contrast
Comparison: None.

CLINICAL DATA: Fever 102.8, new altered mental status, weakness,
chills and fatigue

EXAM:
PORTABLE CHEST 1 VIEW

[chest ap]
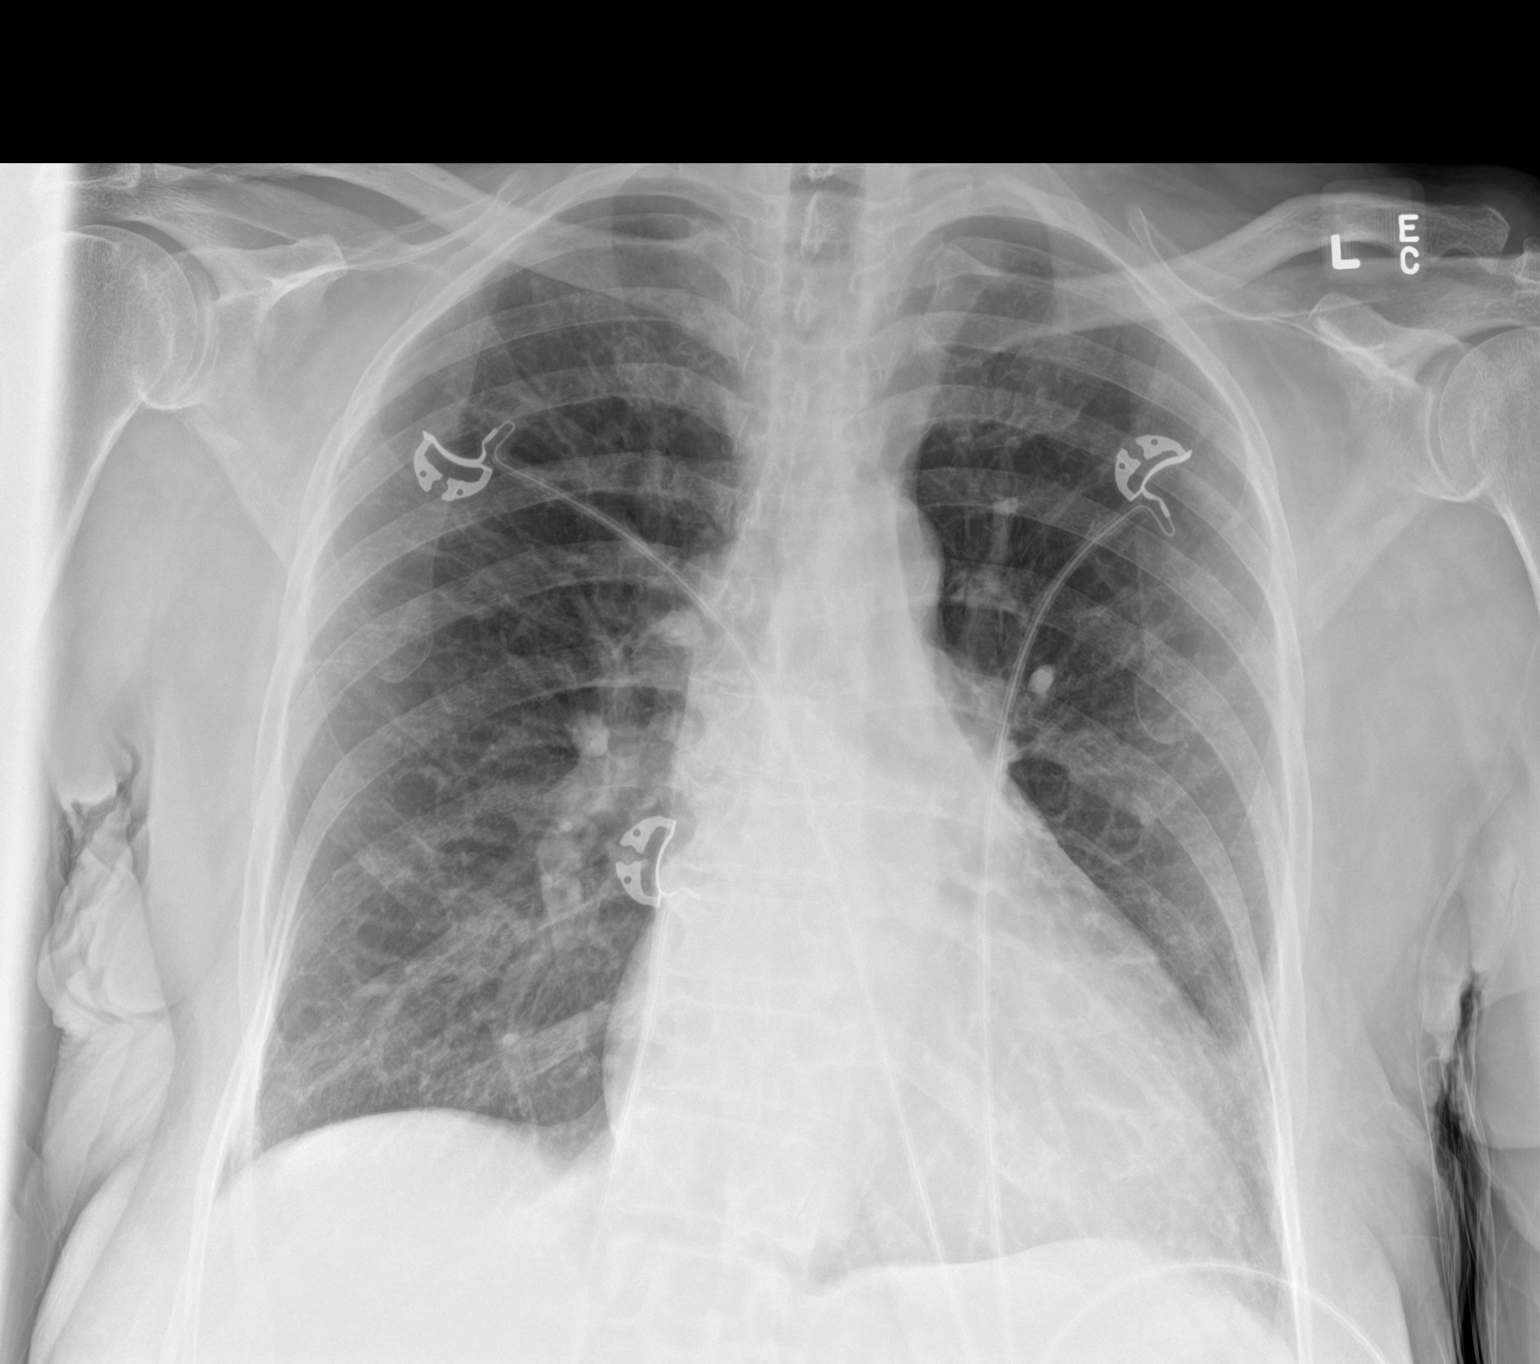

[1 of 1 positions shown; findings below may reference images not displayed]

FINDINGS: Coarse interstitial opacities towards the lung bases. No focally
consolidative opacity is seen however. There is some mild fissural
and septal thickening and central vascular congestion. Cardiac
silhouette may be borderline enlarged though possibly accentuated by
portable technique. The aorta is calcified. The remaining
cardiomediastinal contours are unremarkable. Telemetry leads overlie
the chest. No acute osseous or soft tissue abnormality. Degenerative
changes are present in the imaged spine and shoulders.
IMPRESSION: 1. Basilar interstitial opacities, could reflect edema and/or
atypical infection in the setting of fever.

## 2023-02-26 ENCOUNTER — Other Ambulatory Visit: Payer: Self-pay | Admitting: "Endocrinology

## 2023-02-26 DIAGNOSIS — E210 Primary hyperparathyroidism: Secondary | ICD-10-CM

## 2023-04-17 ENCOUNTER — Ambulatory Visit
Admission: RE | Admit: 2023-04-17 | Discharge: 2023-04-17 | Disposition: A | Discharge status: Home / Self Care | Payer: Medicare Other | Source: Ambulatory Visit | Attending: "Endocrinology | Admitting: "Endocrinology

## 2023-04-17 DIAGNOSIS — E210 Primary hyperparathyroidism: Secondary | ICD-10-CM | POA: Insufficient documentation
